# Patient Record
Sex: Female | Born: 1970
Health system: Southern US, Community
[De-identification: ages and names within clinical notes are randomized; demographics above are authoritative.]

## PROBLEM LIST (undated history)

## (undated) DIAGNOSIS — L509 Urticaria, unspecified: Secondary | ICD-10-CM

## (undated) DIAGNOSIS — E119 Type 2 diabetes mellitus without complications: Secondary | ICD-10-CM

## (undated) DIAGNOSIS — I10 Essential (primary) hypertension: Secondary | ICD-10-CM

## (undated) DIAGNOSIS — R7303 Prediabetes: Secondary | ICD-10-CM

## (undated) DIAGNOSIS — G8929 Other chronic pain: Secondary | ICD-10-CM

## (undated) HISTORY — PX: ABDOMINAL HYSTERECTOMY: SHX81

## (undated) HISTORY — PX: TUBAL LIGATION: SHX77

## (undated) HISTORY — DX: Prediabetes: R73.03

## (undated) HISTORY — PX: BREAST CYST EXCISION: SHX579

## (undated) HISTORY — PX: WISDOM TOOTH EXTRACTION: SHX21

## (undated) HISTORY — DX: Other chronic pain: G89.29

## (undated) HISTORY — PX: DILATION AND CURETTAGE OF UTERUS: SHX78

## (undated) HISTORY — PX: OTHER SURGICAL HISTORY: SHX169

## (undated) NOTE — *Deleted (*Deleted)
Cardiology Office Note:    Date:  04/13/2020   ID:  Crystal Perry, DOB 05-11-1971, MRN [id number]  PCP:  Lavada Mesi, MD  Cardiologist:  Nona Dell, MD  Referring MD: Lavada Mesi, MD   No chief complaint on file.   History of Present Illness:    Crystal Perry is a 35 y.o. female with a hx of *** who is seen as a new consult at the request of Hilts, Michael, MD for the evaluation and management of ***.  Note from 02/09/20 with Dr. Prince Rome reviewed. No specific cardiac concerns noted. Seen in the ER 03/31/20 for dizziness. No orthostatic symptoms, and no drop in vitals per notes with orthostatic vital signs. Referred for low pulse rate, charted as 53 bpm.  Last seen by Dr. Purvis Sheffield in 2015. Had been seen preoperatively for evaluation prior to bariatric surgery. ETT, echo reviewed. Had been recommended to follow up as needed.  Past Medical History:  Diagnosis Date  . Hives   . SVD (spontaneous vaginal delivery)    x 1    Past Surgical History:  Procedure Laterality Date  . ABDOMINAL HYSTERECTOMY    . BREAST CYST EXCISION Right   . cyst from breast     right breast  . CYSTOSCOPY  03/26/2012   Procedure: CYSTOSCOPY;  Surgeon: Esmeralda Arthur, MD;  Location: [facility];  Service: Gynecology;  Laterality: N/A;  . DILATION AND CURETTAGE OF UTERUS    . ROBOTIC ASSISTED SUPRACERVICAL HYSTERECTOMY  03/26/12  . TUBAL LIGATION    . tubes tied    . WISDOM TOOTH EXTRACTION      Current Medications: Current Outpatient Medications on File Prior to Visit  Medication Sig  . baclofen (LIORESAL) 10 MG tablet Take 0.5-1 tablets (5-10 mg total) by mouth 3 (three) times daily as needed for muscle spasms.  Marland Kitchen gabapentin (NEURONTIN) 300 MG capsule 1 PO q HS, may increase to 1 PO TID if needed/tolerated (Patient not taking: Reported on 10/26/2019)  . meclizine (ANTIVERT) 25 MG tablet Take 1 tablet (25 mg total) by mouth 3 (three) times daily as needed for dizziness.  . naproxen (NAPROSYN) 500  MG tablet Take 1 tablet (500 mg total) by mouth 2 (two) times daily as needed.  . promethazine (PHENERGAN) 25 MG tablet Take 1 tablet (25 mg total) by mouth every 6 (six) hours as needed for nausea or vomiting.  . traMADol (ULTRAM) 50 MG tablet Take 1 tablet (50 mg total) by mouth 2 (two) times daily as needed.   No current facility-administered medications on file prior to visit.     Allergies:   Bactrim [sulfamethoxazole-trimethoprim]   Social History   Tobacco Use  . Smoking status: Former Smoker    Quit date: 03/01/2018    Years since quitting: 2.1  . Smokeless tobacco: Never Used  Vaping Use  . Vaping Use: Never used  Substance Use Topics  . Alcohol use: No  . Drug use: No    Family History: family history includes Alcohol abuse in her father; Asthma in her daughter; Breast cancer in her maternal aunt; Cancer in her maternal aunt and maternal grandfather; Cirrhosis in her father; Colon cancer in her maternal grandfather; Diabetes in her maternal grandmother and mother; Hyperlipidemia in her mother; Hypertension in her mother; Prostate cancer in her maternal grandfather; Stroke in her mother.  ROS:   Please see the history of present illness.  Additional pertinent ROS: Constitutional: Negative for chills, fever, night sweats, unintentional weight loss  HENT: Negative for ear pain and hearing loss.   Eyes: Negative for loss of vision and eye pain.  Respiratory: Negative for cough, sputum, wheezing.   Cardiovascular: See HPI. Gastrointestinal: Negative for abdominal pain, melena, and hematochezia.  Genitourinary: Negative for dysuria and hematuria.  Musculoskeletal: Negative for falls and myalgias.  Skin: Negative for itching and rash.  Neurological: Negative for focal weakness, focal sensory changes and loss of consciousness.  Endo/Heme/Allergies: Does not bruise/bleed easily.     EKGs/Labs/Other Studies Reviewed:    The following studies were reviewed today: ***  EKG:   EKG is personally reviewed.  The ekg ordered today demonstrates ***  Recent Labs: 02/09/2020: ALT 17; TSH 2.78 03/31/2020: BUN 19; Creatinine, Ser 0.95; Hemoglobin 13.2; Platelets 424; Potassium 4.7; Sodium 142  Recent Lipid Panel    Component Value Date/Time   CHOL 148 02/09/2020 1053   TRIG 148 02/09/2020 1053   HDL 34 (L) 02/09/2020 1053   CHOLHDL 4.4 02/09/2020 1053   VLDL 14 08/07/2007 2010   LDLCALC 89 02/09/2020 1053    Physical Exam:    VS:  LMP 03/26/2012     Wt Readings from Last 3 Encounters:  03/31/20 201 lb (91.2 kg)  02/09/20 199 lb (90.3 kg)  10/26/19 196 lb (88.9 kg)    GEN: Well nourished, well developed in no acute distress HEENT: Normal, moist mucous membranes NECK: No JVD CARDIAC: regular rhythm, normal S1 and S2, no rubs or gallops. No murmurs. VASCULAR: Radial and DP pulses 2+ bilaterally. No carotid bruits RESPIRATORY:  Clear to auscultation without rales, wheezing or rhonchi  ABDOMEN: Soft, non-tender, non-distended MUSCULOSKELETAL:  Ambulates independently SKIN: Warm and dry, no edema NEUROLOGIC:  Alert and oriented x 3. No focal neuro deficits noted. PSYCHIATRIC:  Normal affect    ASSESSMENT:    No diagnosis found. PLAN:     Cardiac risk counseling and prevention recommendations: -recommend heart healthy/Mediterranean diet, with whole grains, fruits, vegetable, fish, lean meats, nuts, and olive oil. Limit salt. -recommend moderate walking, 3-5 times/week for 30-50 minutes each session. Aim for at least 150 minutes.week. Goal should be pace of 3 miles/hours, or walking 1.5 miles in 30 minutes -recommend avoidance of tobacco products. Avoid excess alcohol. -Additional risk factor control:  -Diabetes risk: A1c is  -Lipids:   -Blood pressure control:  -Weight:  -ASCVD risk score: The 10-year ASCVD risk score Denman George DC Montez Hageman., et al., 2013) is: 7.8%   Values used to calculate the score:     Age: 89 years     Sex: Female     Is Non-Hispanic  African American: Yes     Diabetic: Yes     Tobacco smoker: No     Systolic Blood Pressure: 135 mmHg     Is BP treated: No     HDL Cholesterol: 34 mg/dL     Total Cholesterol: 148 mg/dL    Plan for follow up:  Jodelle Red, MD, PhD [place]  [facility]    Medication Adjustments/Labs and Tests Ordered: Current medicines are reviewed at length with the patient today.  Concerns regarding medicines are outlined above.  No orders of the defined types were placed in this encounter.  No orders of the defined types were placed in this encounter.   There are no Patient Instructions on file for this visit.  Signed, Jodelle Red, MD PhD 04/13/2020 1:15 PM    [facility]

---

## 2002-09-20 ENCOUNTER — Encounter: Payer: Self-pay | Admitting: *Deleted

## 2002-09-20 ENCOUNTER — Ambulatory Visit (HOSPITAL_COMMUNITY): Admission: RE | Admit: 2002-09-20 | Discharge: 2002-09-20 | Payer: Self-pay | Admitting: *Deleted

## 2007-01-19 ENCOUNTER — Emergency Department (HOSPITAL_COMMUNITY): Admission: EM | Admit: 2007-01-19 | Discharge: 2007-01-19 | Payer: Self-pay | Admitting: Emergency Medicine

## 2007-03-17 ENCOUNTER — Ambulatory Visit (HOSPITAL_COMMUNITY): Admission: RE | Admit: 2007-03-17 | Discharge: 2007-03-17 | Payer: Self-pay | Admitting: Urology

## 2007-03-28 ENCOUNTER — Emergency Department (HOSPITAL_COMMUNITY): Admission: EM | Admit: 2007-03-28 | Discharge: 2007-03-28 | Payer: Self-pay | Admitting: Emergency Medicine

## 2007-08-06 ENCOUNTER — Ambulatory Visit: Payer: Self-pay | Admitting: Internal Medicine

## 2007-08-06 DIAGNOSIS — D72829 Elevated white blood cell count, unspecified: Secondary | ICD-10-CM | POA: Insufficient documentation

## 2007-08-06 DIAGNOSIS — E669 Obesity, unspecified: Secondary | ICD-10-CM | POA: Insufficient documentation

## 2007-08-06 DIAGNOSIS — K449 Diaphragmatic hernia without obstruction or gangrene: Secondary | ICD-10-CM | POA: Insufficient documentation

## 2007-08-06 DIAGNOSIS — R599 Enlarged lymph nodes, unspecified: Secondary | ICD-10-CM | POA: Insufficient documentation

## 2007-08-07 ENCOUNTER — Encounter (INDEPENDENT_AMBULATORY_CARE_PROVIDER_SITE_OTHER): Payer: Self-pay | Admitting: Internal Medicine

## 2007-08-08 LAB — CONVERTED CEMR LAB
Basophils Absolute: 0 10*3/uL (ref 0.0–0.1)
Calcium: 8.8 mg/dL (ref 8.4–10.5)
Cholesterol: 109 mg/dL (ref 0–200)
Eosinophils Absolute: 0.2 10*3/uL (ref 0.0–0.7)
Eosinophils Relative: 2 % (ref 0–5)
Glucose, Bld: 94 mg/dL (ref 70–99)
HCT: 37.2 % (ref 36.0–46.0)
Lymphs Abs: 3.6 10*3/uL (ref 0.7–4.0)
MCHC: 31.5 g/dL (ref 30.0–36.0)
MCV: 88.6 fL (ref 78.0–100.0)
Platelets: 391 10*3/uL (ref 150–400)
RDW: 15.2 % (ref 11.5–15.5)
Sodium: 141 meq/L (ref 135–145)
Total CHOL/HDL Ratio: 3.1

## 2007-08-10 ENCOUNTER — Encounter (INDEPENDENT_AMBULATORY_CARE_PROVIDER_SITE_OTHER): Payer: Self-pay | Admitting: Internal Medicine

## 2007-08-10 ENCOUNTER — Telehealth (INDEPENDENT_AMBULATORY_CARE_PROVIDER_SITE_OTHER): Payer: Self-pay | Admitting: *Deleted

## 2007-08-12 ENCOUNTER — Telehealth (INDEPENDENT_AMBULATORY_CARE_PROVIDER_SITE_OTHER): Payer: Self-pay | Admitting: *Deleted

## 2007-08-13 ENCOUNTER — Ambulatory Visit (HOSPITAL_COMMUNITY): Admission: RE | Admit: 2007-08-13 | Discharge: 2007-08-13 | Payer: Self-pay | Admitting: Internal Medicine

## 2007-08-19 ENCOUNTER — Telehealth (INDEPENDENT_AMBULATORY_CARE_PROVIDER_SITE_OTHER): Payer: Self-pay | Admitting: *Deleted

## 2007-08-19 ENCOUNTER — Encounter (INDEPENDENT_AMBULATORY_CARE_PROVIDER_SITE_OTHER): Payer: Self-pay | Admitting: Internal Medicine

## 2007-08-30 ENCOUNTER — Encounter (INDEPENDENT_AMBULATORY_CARE_PROVIDER_SITE_OTHER): Payer: Self-pay | Admitting: Internal Medicine

## 2007-12-01 ENCOUNTER — Emergency Department (HOSPITAL_COMMUNITY): Admission: EM | Admit: 2007-12-01 | Discharge: 2007-12-01 | Payer: Self-pay | Admitting: Emergency Medicine

## 2008-12-08 ENCOUNTER — Encounter (INDEPENDENT_AMBULATORY_CARE_PROVIDER_SITE_OTHER): Payer: Self-pay | Admitting: Internal Medicine

## 2009-02-25 ENCOUNTER — Emergency Department (HOSPITAL_COMMUNITY): Admission: EM | Admit: 2009-02-25 | Discharge: 2009-02-25 | Payer: Self-pay | Admitting: Emergency Medicine

## 2010-06-30 ENCOUNTER — Encounter (HOSPITAL_COMMUNITY): Payer: Self-pay | Admitting: Internal Medicine

## 2011-03-24 LAB — URINALYSIS, ROUTINE W REFLEX MICROSCOPIC
Glucose, UA: NEGATIVE
Hgb urine dipstick: NEGATIVE
Protein, ur: NEGATIVE

## 2011-03-24 LAB — URINE MICROSCOPIC-ADD ON

## 2011-03-24 LAB — URINE CULTURE: Colony Count: NO GROWTH

## 2011-06-10 HISTORY — PX: LAPAROSCOPIC GASTRIC SLEEVE RESECTION: SHX5895

## 2011-06-10 HISTORY — PX: CHOLECYSTECTOMY: SHX55

## 2011-12-17 ENCOUNTER — Encounter: Payer: Self-pay | Admitting: Obstetrics and Gynecology

## 2012-01-21 ENCOUNTER — Encounter: Payer: Self-pay | Admitting: Obstetrics and Gynecology

## 2012-01-22 ENCOUNTER — Encounter: Payer: 59 | Admitting: Obstetrics and Gynecology

## 2012-01-27 ENCOUNTER — Encounter: Payer: 59 | Admitting: Obstetrics and Gynecology

## 2012-02-06 ENCOUNTER — Telehealth: Payer: Self-pay | Admitting: Obstetrics and Gynecology

## 2012-02-06 ENCOUNTER — Ambulatory Visit (INDEPENDENT_AMBULATORY_CARE_PROVIDER_SITE_OTHER): Payer: 59 | Admitting: Obstetrics and Gynecology

## 2012-02-06 ENCOUNTER — Other Ambulatory Visit: Payer: Self-pay | Admitting: Obstetrics and Gynecology

## 2012-02-06 ENCOUNTER — Encounter: Payer: Self-pay | Admitting: Obstetrics and Gynecology

## 2012-02-06 VITALS — BP 116/78 | Temp 98.9°F | Wt 209.0 lb

## 2012-02-06 DIAGNOSIS — N920 Excessive and frequent menstruation with regular cycle: Secondary | ICD-10-CM

## 2012-02-06 DIAGNOSIS — N921 Excessive and frequent menstruation with irregular cycle: Secondary | ICD-10-CM

## 2012-02-06 DIAGNOSIS — R102 Pelvic and perineal pain: Secondary | ICD-10-CM

## 2012-02-06 DIAGNOSIS — N949 Unspecified condition associated with female genital organs and menstrual cycle: Secondary | ICD-10-CM

## 2012-02-06 LAB — POCT URINALYSIS DIPSTICK
Ketones, UA: NEGATIVE
Spec Grav, UA: 1.02
pH, UA: 5

## 2012-02-06 LAB — THYROID PANEL WITH TSH
T4, Total: 9.3 ug/dL (ref 5.0–12.5)
TSH: 1.97 u[IU]/mL (ref 0.350–4.500)

## 2012-02-06 MED ORDER — NAPROXEN SODIUM 550 MG PO TABS: 550.0000 mg | ORAL_TABLET | Freq: Two times a day (BID) | ORAL | Status: DC | PRN

## 2012-02-06 NOTE — Telephone Encounter (Signed)
TC to pt. LM to return call regarding message. 

## 2012-02-06 NOTE — Progress Notes (Signed)
Contraception: tubal ligation History of STD:  no history of PID, STD's History of ovarian cyst: no History of fibroids: no mom had history of fibroids . History of endometriosis:no Previous ultrasound:no  Urinary symptoms: none Gastro-intestinal symptoms:  Constipation: no      Diarrhea: no     Nausea: no     Vomiting: no     Fever: no Vaginal discharge: no vaginal discharge  Pt is a ngyn with complaints of pelvic pain ? Pt's last pap was done 07/2011 wnl . Pt also stated very heavy flow with clots and cramps. Subjective:     Crystal Perry is a 41 y.o. female G2P1011 who presents with complaints of menorrhagia and abdominal/pelvic pain.   Cycle monthly, 4 days with 4 heavy days requiring 1 tampon every hour but needs to wear pad as well. Clots ad 3 cm.Dysmenorrhea starts the day before, and lasts 48 hours. Intensity 8/10 partially improved with Tylenol. BTB for the last 2-3 months, spotting, 1-2 weeks before the cycle. Pelvic pain for 6 months, on/off, not cycle related, sharp with heaviness, low pelvis, no radiation. Intensity 10/10. Was seen at the emergency room in [place] in May. No testing done.  Associated symptoms: nausea Family history: Positive for fibroids in mother ( hyst at 31) .  Pertinent gyn history:   WUJ:WJXBJYN reviewed. No pertinent past medical history.   Medication: (Not in a hospital admission) Allergies:No Known Allergies    Review of systems:   Pertinent items are noted in HPI.  Objective:    BP 116/78  Temp 98.9 F (37.2 C)  Wt 209 lb (94.802 kg)  LMP 01/04/2012  Weight: Wt Readings from Last 1 Encounters:  02/06/12 209 lb (94.802 kg)    BMI: There is no height on file to calculate BMI.  General Appearance: Alert, appropriate appearance for age. No acute distress HEENT: Grossly normal Neck / Thyroid: Supple, no masses, nodes or enlargement Lungs: clear to auscultation bilaterally Back: No CVA tenderness Cardiovascular: Regular rate and rhythm.  S1, S2, no murmur Gastrointestinal: Soft, non-tender, no masses or organomegaly Pelvic Exam: Vulva and vagina appear normal. Bimanual exam reveals normal uterus and adnexa. Rectovaginal: normal rectal, no masses       Assessment and Plan:   Unexplained pelvic pain with menometrorrhagia Blood work and ultrasound ordered Follow-up: results and plan Pt given info on Novasure  Turci, Bonnie J  MD 8/30/201310:14 AM

## 2012-02-06 NOTE — Telephone Encounter (Signed)
TC to pt.  States was informed that Rx for pain was to be given but not at pharmacy. Also did not receive appt for U/S. Per Dr SR, Rx E-scribed. [name] will contact appt with U/S appt.

## 2012-02-06 NOTE — Patient Instructions (Signed)
Novasure

## 2012-02-17 ENCOUNTER — Telehealth: Payer: Self-pay | Admitting: Obstetrics and Gynecology

## 2012-02-17 NOTE — Telephone Encounter (Signed)
LAURA/APPT REQ/SR PT

## 2012-02-20 NOTE — Telephone Encounter (Signed)
LM for pt to call on Monday to discuss options for appt.  Unable to find anything earlier than 03-03-12.  ld

## 2012-03-03 ENCOUNTER — Other Ambulatory Visit: Payer: Self-pay | Admitting: Obstetrics and Gynecology

## 2012-03-03 ENCOUNTER — Ambulatory Visit (INDEPENDENT_AMBULATORY_CARE_PROVIDER_SITE_OTHER): Payer: 59

## 2012-03-03 ENCOUNTER — Ambulatory Visit (INDEPENDENT_AMBULATORY_CARE_PROVIDER_SITE_OTHER): Payer: 59 | Admitting: Obstetrics and Gynecology

## 2012-03-03 ENCOUNTER — Encounter: Payer: Self-pay | Admitting: Obstetrics and Gynecology

## 2012-03-03 VITALS — BP 136/84 | Wt 213.0 lb

## 2012-03-03 DIAGNOSIS — D259 Leiomyoma of uterus, unspecified: Secondary | ICD-10-CM

## 2012-03-03 DIAGNOSIS — R102 Pelvic and perineal pain: Secondary | ICD-10-CM

## 2012-03-03 DIAGNOSIS — N949 Unspecified condition associated with female genital organs and menstrual cycle: Secondary | ICD-10-CM

## 2012-03-03 DIAGNOSIS — D219 Benign neoplasm of connective and other soft tissue, unspecified: Secondary | ICD-10-CM | POA: Insufficient documentation

## 2012-03-03 NOTE — Progress Notes (Signed)
Subjective:    Crystal Perry is a 41 y.o. female, G2P1011, who presents for Gyn ultrasound because of menorrhagia and pelvic pain.  The following portions of the patient's history were reviewed and updated as appropriate: allergies, current medications, past family history.  Objective:    BP 136/84  Wt 213 lb (96.616 kg)  LMP 02/23/2012    Weight:  Wt Readings from Last 1 Encounters:  03/03/12 213 lb (96.616 kg)          BMI: There is no height on file to calculate BMI.  ULTRASOUND: Uterus   AV    LABS: FSH normal    Adnexa        Prolactin normal    Endometrium 0.342 mm   Thyroid panel normal    Free fluid: no    Other findings:    RT mometrial:  2.9 x 3 x 2.9cm    RT fundus: 2.3 x 1.7 x 1.9cm    Mid ut. Anterior:  1.3 x 1.2 x 1cm    Several other 1 cm fibroids are seen.    CX:  Multiple 1 - 1.5cm nabothian cysts are seen.    Thin endometrium.  3D images confirm no submucosal fibroids.    RTOV: simple cyst = 3 x 3.3 x 3cm    LTOV WNL          Assessment:    Symptomatic uterine fibroids    Plan:     The following was reviewed with patient:    Benign nature of fibroids as well as unpredictable growth during reproductive years.      Possible fibroid symptoms including: menorrhagia, dysmenorrhea, urinary frequency, pelvic pain, and back pain.     Expected resolution/improvement of symptoms with menopausal state.    Treatment options: expectant management, NSAIDS, oral contraceptives,     Depo Provera, Uterine Artery Embolization, myomectomy, and hysterectomy.  Surgical approach options were also reviewed including, laparoscopic, robotically assisted and abdominal. Risks, benefits and limitations also discussed.  After 25  minutes face to face encounter, the patient requests to proceed with robotic hysterectomy but will try Loloestrin ( to start today) in the meantime. 2 samples given

## 2012-03-03 NOTE — Patient Instructions (Signed)
Patient Education Materials to be provided at check out (*indicates is located in Garment/textile technologist):  *Da Vinci Hysterectomy

## 2012-03-04 ENCOUNTER — Telehealth: Payer: Self-pay | Admitting: Obstetrics and Gynecology

## 2012-03-04 NOTE — Telephone Encounter (Signed)
Robotic Hysterectomy scheduled for 03/26/12 @ 7:30am with SR/EP.  [employer] effective 06/10/11. Plan pays 80/20 after a $400 deductible.  Pre-op due $339.75  -Adrianne Pridgen

## 2012-03-05 ENCOUNTER — Other Ambulatory Visit: Payer: Self-pay | Admitting: Obstetrics and Gynecology

## 2012-03-08 ENCOUNTER — Encounter (HOSPITAL_COMMUNITY): Payer: Self-pay | Admitting: Pharmacist

## 2012-03-10 ENCOUNTER — Telehealth: Payer: Self-pay | Admitting: Obstetrics and Gynecology

## 2012-03-10 NOTE — Telephone Encounter (Signed)
sr pt 

## 2012-03-10 NOTE — Telephone Encounter (Signed)
Pt has questions concerning surgery and possible r/s due to new job.  Will call me on Fri with final information.  ld

## 2012-03-16 ENCOUNTER — Encounter (HOSPITAL_COMMUNITY): Payer: Self-pay

## 2012-03-16 ENCOUNTER — Encounter (HOSPITAL_COMMUNITY)
Admission: RE | Admit: 2012-03-16 | Discharge: 2012-03-16 | Disposition: A | Discharge status: Home / Self Care | Payer: 59 | Source: Ambulatory Visit | Attending: Obstetrics and Gynecology | Admitting: Obstetrics and Gynecology

## 2012-03-16 LAB — CBC
HCT: 36.1 % (ref 36.0–46.0)
MCV: 85.5 fL (ref 78.0–100.0)
Platelets: 473 10*3/uL — ABNORMAL HIGH (ref 150–400)
RBC: 4.22 MIL/uL (ref 3.87–5.11)
WBC: 11 10*3/uL — ABNORMAL HIGH (ref 4.0–10.5)

## 2012-03-16 NOTE — Patient Instructions (Signed)
   Your procedure is scheduled on: Friday, Oct 18  Enter through the Main Entrance of [facility] at: 6 am Pick up the phone at the desk and dial [phone] and inform us of your arrival.  Please call this number if you have any problems the morning of surgery: [phone]  Remember: Do not eat food after midnight: Thursday Do not drink clear liquids after: Thursday Take these medicines the morning of surgery with a SIP OF WATER:none  Do not wear jewelry, make-up, or FINGER nail polish No metal in your hair or on your body. Do not wear lotions, powders, perfumes. You may wear deodorant.  Please use your CHG wash as directed prior to surgery.  Do not shave anywhere for at least 12 hours prior to first CHG shower.  Do not bring valuables to the hospital. Contacts, dentures or bridgework may not be worn into surgery.  Leave suitcase in the car. After Surgery it may be brought to your room. For patients being admitted to the hospital, checkout time is 11:00am the day of discharge.   Home with friend Sherri Scales cell [phone].  Patients discharged on the day of surgery will not be allowed to drive home.

## 2012-03-23 ENCOUNTER — Ambulatory Visit (INDEPENDENT_AMBULATORY_CARE_PROVIDER_SITE_OTHER): Payer: 59 | Admitting: Obstetrics and Gynecology

## 2012-03-23 ENCOUNTER — Encounter: Payer: Self-pay | Admitting: Obstetrics and Gynecology

## 2012-03-23 VITALS — BP 122/80 | HR 68 | Temp 98.6°F | Resp 16 | Ht 63.0 in | Wt 214.0 lb

## 2012-03-23 DIAGNOSIS — Z01818 Encounter for other preprocedural examination: Secondary | ICD-10-CM

## 2012-03-23 NOTE — Progress Notes (Signed)
Crystal Perry is a 41 y.o. female G2P1011 who presents for hysterectomy because of menometrorrhagia, pelvic pain and uterine fibroids.  Over the past year, Crystal Perry's periods have become heavier and bleeding pattern erratic.  Her menstrual flow, that  lasts for four days,  requires the hourly change of a pad and tampon.  In addition, the patient will bleed for seven days at other times during the month, requiring a pad and tampon change every forty-five minutes. With her menses, the patient will have cramping rated at a 10/10 (on a 10 point pain scale)  with minimal relief from Tylenol. For the past three months Crystal Perry has  experienced intermittent pelvic pain,  throughout the month, without initiating or alleviating factors that will sometimes keep her from  work.  This pain is sharp and is accompanied by a feeling of heaviness and pressure in the pelvic area.  She goes on to report urinary frequency, dyspareunia and lower back pain but denies vaginitis symptoms, incontinence,  or change in bowel movements. On occasion she has had to visit the ER in [place] for pain management.  A pelvic ultrasound in August 2013 showed: uterus-9.64 x 6.51 x 5.54 cm, endometrium-0.342 cm; Multiple myometrial fibroids:  right 2.9 x 1.7 x 2.9 cm, right fundus 2.3 x 1.7 x 1.9 cm and midline anterior 1.3 x 1.2 x 1.0 cm with several other 1 cm fibroids; ovaries appear normal except there was a right simple cyst 3.0 x 3.3 x 3.0 cm. Lab results of a TSH and  Prolactin,  done at that same time,  were normal and an  FSH revealed non-menopausal.  A review of both medical and surgical management options were given to the patient however, she desires definitive therapy in the form of hysterectomy.  She is aware that relief of her pain cannot be guaranteed however, the hysterectomy would alleviate her periods.   Past Medical History  OB History: G2P1011 SVD, 1996 (5 lbs, 3 oz)  GYN History: menarche-41 YO;  LMP-02/23/12;  Contracepton:  Tubal Sterilization;  The patient denies history of sexually transmitted disease.  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 naproxen sodium (ANAPROX) 550 MG tablet Take 550 mg by mouth as needed. As needed for pain, can take every 12 hours as needed      . Norethin-Eth Estrad-Fe Biphas (LO LOESTRIN FE PO) Take 1 tablet by mouth. Pt started on 03/01/12.        No Known Allergies  Denies sensitivity to peanuts, shellfish, soy, latex or adhesives.   ROS:  Denies headache, vision changes, nasal congestion, dysphagia, tinnitus, dizziness, hoarseness, cough,  chest pain, shortness of breath, nausea, vomiting, diarrhea,constipation,  urinary urgency  dysuria, hematuria, vaginitis symptoms, swelling of joints,easy bruising,  myalgias, arthralgias, skin rashes, unexplained weight loss and except as is mentioned in the history of present illness, patient's review of systems is otherwise negative.  Physical Exam    BP 122/80  Pulse 68  Temp 98.6 F (37 C) (Oral)  Resp 16    Ht 5' 3" (1.6 m)  Wt 214 lb (97.07 kg)  BMI 37.91 kg/m2  LMP 02/23/2012  Neck: supple without masses or thyromegaly Lungs: clear to auscultation Heart: regular rate and rhythm Abdomen: soft, non-tender and no organomegaly  Pelvic:EGBUS- wnl; vagina-normal rugae, moderate blood; uterus-appears ULNS though exam limited by habitus, cervix without lesions or motion tenderness; adnexae-no tenderness or  masses Extremities:  no clubbing, cyanosis or edema   Assesment:  Menometrorrhagia                       Pelvic Pain            Uterine Fibroids   Disposition:  The patient was given the indication for her procedure(s) along with the risks, which include but are not limited to, reaction to anesthesia, damage to adjacent organs, infection, excessive bleeding, formation of scar tissue, early menopause, pelvic prolapse and the possible need for an open abdominal incision. She was further advised that she will experience transient post operative facial edema, that her hospital stay is expected to be 0-2 days, she should be able to return to her usual activities within 2-3 weeks (except intercourse to be delayed until 6 weeks) and that the robotic approach to her surgery requires more time to perform than an open abdominal approach.  Patient was given the Miralax bowel prep to be completed 24 hours prior to procedure.  She verbalized understanding of these risks and pre-operative instructions and has consented to proceed with a  Robot Assisted Total Laparoscopic Hysterectomy at [facility], March 26, 2012 at 7:30 a.m.  CSN# [id number]   Crystal Eline J. Tzivia Oneil, PA-C  for Crystal Perry   

## 2012-03-23 NOTE — H&P (Signed)
Crystal Perry is a 41 y.o. female G2P1011 who presents for hysterectomy because of menometrorrhagia, pelvic pain and uterine fibroids.  Over the past year, Crystal Perry periods have become heavier and bleeding pattern erratic.  Her menstrual flow, that  lasts for four days,  requires the hourly change of a pad and tampon.  In addition, the patient will bleed for seven days at other times during the month, requiring a pad and tampon change every forty-five minutes. With her menses, the patient will have cramping rated at a 10/10 (on a 10 point pain scale)  with minimal relief from Tylenol. For the past three months Crystal Perry has  experienced intermittent pelvic pain,  throughout the month, without initiating or alleviating factors that will sometimes keep her from  work.  This pain is sharp and is accompanied by a feeling of heaviness and pressure in the pelvic area.  She goes on to report urinary frequency, dyspareunia and lower back pain but denies vaginitis symptoms, incontinence,  or change in bowel movements. On occasion she has had to visit the ER in [place] for pain management.  A pelvic ultrasound in August 2013 showed: uterus-9.64 x 6.51 x 5.54 cm, endometrium-0.342 cm; Multiple myometrial fibroids:  right 2.9 x 1.7 x 2.9 cm, right fundus 2.3 x 1.7 x 1.9 cm and midline anterior 1.3 x 1.2 x 1.0 cm with several other 1 cm fibroids; ovaries appear normal except there was a right simple cyst 3.0 x 3.3 x 3.0 cm. Lab results of a TSH and  Prolactin,  done at that same time,  were normal and an  [facility] revealed non-menopausal.  A review of both medical and surgical management options were given to the patient however, she desires definitive therapy in the form of hysterectomy.  She is aware that relief of her pain cannot be guaranteed however, the hysterectomy would alleviate her periods.   Past Medical History  OB History: G2P1011 SVD, 1996 (5 lbs, 3 oz)  GYN History: menarche-41 YO;  LMP-02/23/12;  Contracepton:  Tubal Sterilization;  The patient denies history of sexually transmitted disease.  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 naproxen sodium (ANAPROX) 550 MG tablet Take 550 mg by mouth as needed. As needed for pain, can take every 12 hours as needed      . Norethin-Eth Estrad-Fe Biphas (LO LOESTRIN FE PO) Take 1 tablet by mouth. Pt started on 03/01/12.        No Known Allergies  Denies sensitivity to peanuts, shellfish, soy, latex or adhesives.   ROS:  Denies headache, vision changes, nasal congestion, dysphagia, tinnitus, dizziness, hoarseness, cough,  chest pain, shortness of breath, nausea, vomiting, diarrhea,constipation,  urinary urgency  dysuria, hematuria, vaginitis symptoms, swelling of joints,easy bruising,  myalgias, arthralgias, skin rashes, unexplained weight loss and except as is mentioned in the history of present illness, patient's review of systems is otherwise negative.  Physical Exam    BP 122/80  Pulse 68  Temp 98.6 F (37 C) (Oral)  Resp 16  Ht 5\' 3"  (1.6 m)  Wt 214 lb (97.07 kg)  BMI 37.91 kg/m2  LMP 02/23/2012  Neck: supple without masses or thyromegaly Lungs: clear to auscultation Heart: regular rate and rhythm Abdomen: soft, non-tender and no organomegaly  Pelvic:EGBUS- wnl; vagina-normal rugae, moderate blood; uterus-appears ULNS though exam limited by habitus, cervix without lesions or motion tenderness; adnexae-no tenderness or  masses Extremities:  no clubbing, cyanosis or edema   Assesment:  Menometrorrhagia                       Pelvic Pain            Uterine Fibroids   Disposition:  The patient was given the indication for her procedure(s) along with the risks, which include but are not limited to, reaction to anesthesia, damage to adjacent organs, infection, excessive bleeding, formation of scar tissue, early menopause, pelvic prolapse and the possible need for an open abdominal incision. She was further advised that she will experience transient post operative facial edema, that her hospital stay is expected to be 0-2 days, she should be able to return to her usual activities within 2-3 weeks (except intercourse to be delayed until 6 weeks) and that the robotic approach to her surgery requires more time to perform than an open abdominal approach.  Patient was given the Miralax bowel prep to be completed 24 hours prior to procedure.  She verbalized understanding of these risks and pre-operative instructions and has consented to proceed with a  Robot Assisted Total Laparoscopic Hysterectomy at [facility] [place], March 26, 2012 at 7:30 a.m.  CSN# [id number]   Jillian Pianka J. Lowell Guitar, PA-C  for Dr. Crist Fat. Rivard

## 2012-03-25 MED ORDER — DEXTROSE 5 % IV SOLN
2.0000 g | INTRAVENOUS | Status: AC
  Administered 2012-03-26: 2 g via INTRAVENOUS
  Filled 2012-03-25: qty 2

## 2012-03-26 ENCOUNTER — Encounter (HOSPITAL_COMMUNITY): Payer: Self-pay | Admitting: Anesthesiology

## 2012-03-26 ENCOUNTER — Ambulatory Visit (HOSPITAL_COMMUNITY)
Admission: RE | Admit: 2012-03-26 | Discharge: 2012-03-26 | Disposition: A | Discharge status: Home / Self Care | Payer: 59 | Source: Ambulatory Visit | Attending: Obstetrics and Gynecology | Admitting: Obstetrics and Gynecology

## 2012-03-26 ENCOUNTER — Ambulatory Visit (HOSPITAL_COMMUNITY): Payer: 59 | Admitting: Anesthesiology

## 2012-03-26 ENCOUNTER — Encounter (HOSPITAL_COMMUNITY)
Admission: RE | Disposition: A | Discharge status: Home / Self Care | Payer: Self-pay | Source: Ambulatory Visit | Attending: Obstetrics and Gynecology

## 2012-03-26 ENCOUNTER — Encounter (HOSPITAL_COMMUNITY): Payer: Self-pay

## 2012-03-26 DIAGNOSIS — N8000 Endometriosis of the uterus, unspecified: Secondary | ICD-10-CM | POA: Insufficient documentation

## 2012-03-26 DIAGNOSIS — N949 Unspecified condition associated with female genital organs and menstrual cycle: Secondary | ICD-10-CM | POA: Insufficient documentation

## 2012-03-26 DIAGNOSIS — D259 Leiomyoma of uterus, unspecified: Secondary | ICD-10-CM

## 2012-03-26 DIAGNOSIS — N920 Excessive and frequent menstruation with regular cycle: Secondary | ICD-10-CM | POA: Insufficient documentation

## 2012-03-26 HISTORY — PX: CYSTOSCOPY: SHX5120

## 2012-03-26 HISTORY — PX: ROBOTIC ASSISTED SUPRACERVICAL HYSTERECTOMY: SHX6083

## 2012-03-26 SURGERY — ROBOTIC ASSISTED SUPRACERVICAL HYSTERECTOMY
Anesthesia: General | Site: Bladder | Wound class: Clean Contaminated

## 2012-03-26 MED ORDER — OXYCODONE-ACETAMINOPHEN 5-325 MG PO TABS
1.0000 | ORAL_TABLET | ORAL | Status: DC | PRN
  Administered 2012-03-26: 2 via ORAL
  Filled 2012-03-26: qty 2

## 2012-03-26 MED ORDER — GLYCOPYRROLATE 0.2 MG/ML IJ SOLN
INTRAMUSCULAR | Status: AC
  Filled 2012-03-26: qty 1

## 2012-03-26 MED ORDER — NEOSTIGMINE METHYLSULFATE 1 MG/ML IJ SOLN
INTRAMUSCULAR | Status: AC
  Filled 2012-03-26: qty 10

## 2012-03-26 MED ORDER — KETOROLAC TROMETHAMINE 60 MG/2ML IM SOLN: 30.0000 mg | Freq: Once | INTRAMUSCULAR | Status: DC

## 2012-03-26 MED ORDER — SCOPOLAMINE 1 MG/3DAYS TD PT72
MEDICATED_PATCH | TRANSDERMAL | Status: DC | PRN
  Administered 2012-03-26: 1 via TRANSDERMAL

## 2012-03-26 MED ORDER — BUPIVACAINE HCL (PF) 0.25 % IJ SOLN
INTRAMUSCULAR | Status: AC
  Filled 2012-03-26: qty 30

## 2012-03-26 MED ORDER — ROCURONIUM BROMIDE 100 MG/10ML IV SOLN
INTRAVENOUS | Status: DC | PRN
  Administered 2012-03-26 (×3): 20 mg via INTRAVENOUS
  Administered 2012-03-26: 50 mg via INTRAVENOUS

## 2012-03-26 MED ORDER — FENTANYL CITRATE 0.05 MG/ML IJ SOLN
INTRAMUSCULAR | Status: AC
  Filled 2012-03-26: qty 5

## 2012-03-26 MED ORDER — KETOROLAC TROMETHAMINE 30 MG/ML IJ SOLN
15.0000 mg | Freq: Once | INTRAMUSCULAR | Status: AC | PRN
  Administered 2012-03-26: 30 mg via INTRAVENOUS

## 2012-03-26 MED ORDER — IBUPROFEN 600 MG PO TABS
600.0000 mg | ORAL_TABLET | Freq: Four times a day (QID) | ORAL | Status: DC | PRN
  Administered 2012-03-26: 600 mg via ORAL
  Filled 2012-03-26: qty 1

## 2012-03-26 MED ORDER — IBUPROFEN 600 MG PO TABS: ORAL_TABLET | ORAL | Status: DC

## 2012-03-26 MED ORDER — LACTATED RINGERS IR SOLN
Status: DC | PRN
  Administered 2012-03-26: 3000 mL

## 2012-03-26 MED ORDER — STERILE WATER FOR IRRIGATION IR SOLN
Status: DC | PRN
  Administered 2012-03-26: 1000 mL via INTRAVESICAL

## 2012-03-26 MED ORDER — MENTHOL 3 MG MT LOZG: 1.0000 | LOZENGE | OROMUCOSAL | Status: DC | PRN

## 2012-03-26 MED ORDER — PROPOFOL 10 MG/ML IV EMUL
INTRAVENOUS | Status: DC | PRN
  Administered 2012-03-26: 250 mg via INTRAVENOUS

## 2012-03-26 MED ORDER — MIDAZOLAM HCL 5 MG/5ML IJ SOLN
INTRAMUSCULAR | Status: DC | PRN
  Administered 2012-03-26: 2 mg via INTRAVENOUS

## 2012-03-26 MED ORDER — BUPIVACAINE HCL (PF) 0.25 % IJ SOLN
INTRAMUSCULAR | Status: DC | PRN
  Administered 2012-03-26: 17 mL

## 2012-03-26 MED ORDER — INDIGOTINDISULFONATE SODIUM 8 MG/ML IJ SOLN
INTRAMUSCULAR | Status: AC
  Filled 2012-03-26: qty 5

## 2012-03-26 MED ORDER — ONDANSETRON HCL 4 MG PO TABS
4.0000 mg | ORAL_TABLET | Freq: Three times a day (TID) | ORAL | Status: DC | PRN
  Filled 2012-03-26: qty 1

## 2012-03-26 MED ORDER — ONDANSETRON HCL 4 MG/2ML IJ SOLN
INTRAMUSCULAR | Status: AC
  Filled 2012-03-26: qty 2

## 2012-03-26 MED ORDER — GLYCOPYRROLATE 0.2 MG/ML IJ SOLN
INTRAMUSCULAR | Status: AC
  Filled 2012-03-26: qty 4

## 2012-03-26 MED ORDER — KETOROLAC TROMETHAMINE 30 MG/ML IJ SOLN
INTRAMUSCULAR | Status: AC
  Filled 2012-03-26: qty 1

## 2012-03-26 MED ORDER — ONDANSETRON HCL 4 MG/2ML IJ SOLN
INTRAMUSCULAR | Status: DC | PRN
  Administered 2012-03-26: 4 mg via INTRAVENOUS

## 2012-03-26 MED ORDER — LIDOCAINE HCL (CARDIAC) 20 MG/ML IV SOLN
INTRAVENOUS | Status: DC | PRN
  Administered 2012-03-26: 20 mg via INTRAVENOUS

## 2012-03-26 MED ORDER — INDIGOTINDISULFONATE SODIUM 8 MG/ML IJ SOLN
INTRAMUSCULAR | Status: DC | PRN
  Administered 2012-03-26: 5 mL via INTRAVENOUS

## 2012-03-26 MED ORDER — ROCURONIUM BROMIDE 50 MG/5ML IV SOLN
INTRAVENOUS | Status: AC
  Filled 2012-03-26: qty 1

## 2012-03-26 MED ORDER — OXYCODONE-ACETAMINOPHEN 5-325 MG PO TABS: 1.0000 | ORAL_TABLET | ORAL | Status: DC | PRN

## 2012-03-26 MED ORDER — ACETAMINOPHEN 10 MG/ML IV SOLN: 1000.0000 mg | Freq: Once | INTRAVENOUS | Status: DC

## 2012-03-26 MED ORDER — ONDANSETRON HCL 4 MG PO TABS: 4.0000 mg | ORAL_TABLET | Freq: Three times a day (TID) | ORAL | Status: DC | PRN

## 2012-03-26 MED ORDER — LIDOCAINE HCL (CARDIAC) 20 MG/ML IV SOLN
INTRAVENOUS | Status: AC
  Filled 2012-03-26: qty 5

## 2012-03-26 MED ORDER — PROPOFOL 10 MG/ML IV EMUL
INTRAVENOUS | Status: AC
  Filled 2012-03-26: qty 20

## 2012-03-26 MED ORDER — LACTATED RINGERS IV SOLN
INTRAVENOUS | Status: DC
  Administered 2012-03-26 (×3): via INTRAVENOUS

## 2012-03-26 MED ORDER — SUCCINYLCHOLINE CHLORIDE 20 MG/ML IJ SOLN
INTRAMUSCULAR | Status: DC | PRN
  Administered 2012-03-26: 140 mg via INTRAVENOUS

## 2012-03-26 MED ORDER — FENTANYL CITRATE 0.05 MG/ML IJ SOLN
INTRAMUSCULAR | Status: DC | PRN
  Administered 2012-03-26: 50 ug via INTRAVENOUS
  Administered 2012-03-26 (×2): 100 ug via INTRAVENOUS
  Administered 2012-03-26 (×2): 50 ug via INTRAVENOUS
  Administered 2012-03-26: 100 ug via INTRAVENOUS
  Administered 2012-03-26 (×3): 50 ug via INTRAVENOUS

## 2012-03-26 MED ORDER — DEXAMETHASONE SODIUM PHOSPHATE 10 MG/ML IJ SOLN
INTRAMUSCULAR | Status: AC
  Filled 2012-03-26: qty 1

## 2012-03-26 MED ORDER — DEXAMETHASONE SODIUM PHOSPHATE 4 MG/ML IJ SOLN
INTRAMUSCULAR | Status: DC | PRN
  Administered 2012-03-26: 10 mg via INTRAVENOUS

## 2012-03-26 MED ORDER — SUCCINYLCHOLINE CHLORIDE 20 MG/ML IJ SOLN
INTRAMUSCULAR | Status: AC
  Filled 2012-03-26: qty 10

## 2012-03-26 MED ORDER — GLYCOPYRROLATE 0.2 MG/ML IJ SOLN
INTRAMUSCULAR | Status: DC | PRN
  Administered 2012-03-26 (×2): 0.2 mg via INTRAVENOUS

## 2012-03-26 MED ORDER — MIDAZOLAM HCL 2 MG/2ML IJ SOLN
INTRAMUSCULAR | Status: AC
  Filled 2012-03-26: qty 2

## 2012-03-26 MED ORDER — KETOROLAC TROMETHAMINE 30 MG/ML IJ SOLN
INTRAMUSCULAR | Status: AC
  Administered 2012-03-26: 30 mg via INTRAMUSCULAR
  Filled 2012-03-26: qty 2

## 2012-03-26 MED ORDER — LACTATED RINGERS IV SOLN
INTRAVENOUS | Status: DC
  Administered 2012-03-26: 13:00:00 via INTRAVENOUS

## 2012-03-26 MED ORDER — FENTANYL CITRATE 0.05 MG/ML IJ SOLN
INTRAMUSCULAR | Status: AC
  Filled 2012-03-26: qty 2

## 2012-03-26 MED ORDER — FENTANYL CITRATE 0.05 MG/ML IJ SOLN: 25.0000 ug | INTRAMUSCULAR | Status: DC | PRN

## 2012-03-26 MED ORDER — FENTANYL CITRATE 0.05 MG/ML IJ SOLN
INTRAMUSCULAR | Status: AC
Start: 2012-03-26 — End: 2012-03-26
  Filled 2012-03-26: qty 2

## 2012-03-26 SURGICAL SUPPLY — 68 items
BAG URINE DRAINAGE (UROLOGICAL SUPPLIES) ×4 IMPLANT
BARRIER ADHS 3X4 INTERCEED (GAUZE/BANDAGES/DRESSINGS) ×4 IMPLANT
BENZOIN TINCTURE PRP APPL 2/3 (GAUZE/BANDAGES/DRESSINGS) IMPLANT
BLADE LAP MORCELLATOR 15X9.5 (ELECTROSURGICAL) ×4 IMPLANT
CABLE HIGH FREQUENCY MONO STRZ (ELECTRODE) ×4 IMPLANT
CATH FOLEY 3WAY  5CC 16FR (CATHETERS) ×1
CATH FOLEY 3WAY  5CC 18FR (CATHETERS)
CATH FOLEY 3WAY 5CC 16FR (CATHETERS) ×3 IMPLANT
CATH FOLEY 3WAY 5CC 18FR (CATHETERS) IMPLANT
CHLORAPREP W/TINT 26ML (MISCELLANEOUS) ×4 IMPLANT
CLOTH BEACON ORANGE TIMEOUT ST (SAFETY) ×4 IMPLANT
CONT PATH 16OZ SNAP LID 3702 (MISCELLANEOUS) ×4 IMPLANT
CORDS BIPOLAR (ELECTRODE) IMPLANT
COVER MAYO STAND STRL (DRAPES) ×4 IMPLANT
COVER TABLE BACK 60X90 (DRAPES) ×8 IMPLANT
COVER TIP SHEARS 8 DVNC (MISCELLANEOUS) ×3 IMPLANT
COVER TIP SHEARS 8MM DA VINCI (MISCELLANEOUS) ×1
DECANTER SPIKE VIAL GLASS SM (MISCELLANEOUS) IMPLANT
DERMABOND ADVANCED (GAUZE/BANDAGES/DRESSINGS) ×1
DERMABOND ADVANCED .7 DNX12 (GAUZE/BANDAGES/DRESSINGS) ×3 IMPLANT
DRAPE HUG U DISPOSABLE (DRAPE) ×4 IMPLANT
DRAPE LG THREE QUARTER DISP (DRAPES) ×8 IMPLANT
DRAPE MONITOR DA VINCI (DRAPE) IMPLANT
DRAPE WARM FLUID 44X44 (DRAPE) ×4 IMPLANT
ELECT REM PT RETURN 9FT ADLT (ELECTROSURGICAL) ×4
ELECTRODE REM PT RTRN 9FT ADLT (ELECTROSURGICAL) ×3 IMPLANT
EVACUATOR SMOKE 8.L (FILTER) ×4 IMPLANT
GAUZE VASELINE 3X9 (GAUZE/BANDAGES/DRESSINGS) ×4 IMPLANT
GLOVE BIOGEL PI IND STRL 7.0 (GLOVE) ×9 IMPLANT
GLOVE BIOGEL PI INDICATOR 7.0 (GLOVE) ×3
GLOVE ECLIPSE 6.5 STRL STRAW (GLOVE) ×12 IMPLANT
GOWN STRL REIN XL XLG (GOWN DISPOSABLE) ×32 IMPLANT
GRASPER BIPOLAR FEN DA VINCI (INSTRUMENTS)
GRASPER BPLR FEN DVNC (INSTRUMENTS) IMPLANT
KIT ACCESSORY DA VINCI DISP (KITS) ×1
KIT ACCESSORY DVNC DISP (KITS) ×3 IMPLANT
KIT DISP ACCESSORY 4 ARM (KITS) IMPLANT
NEEDLE HYPO 22GX1.5 SAFETY (NEEDLE) ×4 IMPLANT
OCCLUDER COLPOPNEUMO (BALLOONS) ×4 IMPLANT
PACK LAVH (CUSTOM PROCEDURE TRAY) ×4 IMPLANT
PAD PREP 24X48 CUFFED NSTRL (MISCELLANEOUS) ×8 IMPLANT
PLUG CATH AND CAP STER (CATHETERS) ×4 IMPLANT
PROTECTOR NERVE ULNAR (MISCELLANEOUS) ×12 IMPLANT
SET CYSTO W/LG BORE CLAMP LF (SET/KITS/TRAYS/PACK) ×4 IMPLANT
SET IRRIG TUBING LAPAROSCOPIC (IRRIGATION / IRRIGATOR) ×4 IMPLANT
SLEEVE SURGEON STRL (DRAPES) ×4 IMPLANT
SOLUTION ELECTROLUBE (MISCELLANEOUS) ×4 IMPLANT
SPONGE LAP 18X18 X RAY DECT (DISPOSABLE) IMPLANT
STRIP CLOSURE SKIN 1/2X4 (GAUZE/BANDAGES/DRESSINGS) IMPLANT
SUT MNCRL AB 3-0 PS2 27 (SUTURE) ×4 IMPLANT
SUT VIC AB 0 CT1 27 (SUTURE) ×6
SUT VIC AB 0 CT1 27XBRD ANBCTR (SUTURE) ×6 IMPLANT
SUT VIC AB 0 CT1 27XBRD ANTBC (SUTURE) ×12 IMPLANT
SUT VICRYL 0 UR6 27IN ABS (SUTURE) ×8 IMPLANT
SYR 50ML LL SCALE MARK (SYRINGE) ×4 IMPLANT
SYSTEM CONVERTIBLE TROCAR (TROCAR) ×4 IMPLANT
TIP UTERINE 5.1X6CM LAV DISP (MISCELLANEOUS) IMPLANT
TIP UTERINE 6.7X10CM GRN DISP (MISCELLANEOUS) IMPLANT
TIP UTERINE 6.7X6CM WHT DISP (MISCELLANEOUS) IMPLANT
TIP UTERINE 6.7X8CM BLUE DISP (MISCELLANEOUS) ×4 IMPLANT
TOWEL OR 17X24 6PK STRL BLUE (TOWEL DISPOSABLE) ×12 IMPLANT
TROCAR 12M 150ML BLUNT (TROCAR) ×4 IMPLANT
TROCAR DISP BLADELESS 8 DVNC (TROCAR) ×3 IMPLANT
TROCAR DISP BLADELESS 8MM (TROCAR) ×1
TROCAR XCEL 12X100 BLDLESS (ENDOMECHANICALS) ×4 IMPLANT
TUBING FILTER THERMOFLATOR (ELECTROSURGICAL) ×4 IMPLANT
WARMER LAPAROSCOPE (MISCELLANEOUS) ×4 IMPLANT
WATER STERILE IRR 1000ML POUR (IV SOLUTION) ×12 IMPLANT

## 2012-03-26 NOTE — Op Note (Signed)
preoperative diagnosis: symptomatic uterine fibroids  Postoperative diagnosis: Same  Anesthesia: Gen.  Anesthesiologist: Dr. Mal Amabile  Procedure: Robotically-assisted supracervical hysterectomy and cystoscopy  Surgeon: Dr. Dois Davenport Aryon Nham  Assistant: Henreitta Leber P.A.-C.  Estimated blood loss: 50 cc  Procedure:  After being informed of the planned procedure with possible complications including but not limited to bleeding, infection, injury to other organs, need for laparotomy, expected hospitals they in recovery, informed consent was obtained and patient was taken to or #7. She was given general anesthesia with endotracheal intubation without any complication. She was placed in the lithotomy position on a sticky mattress and beanbag with arms padded and tucked on each side and knee-high sequential compressive devices. She was then prepped and draped in a sterile fashion.  Pelvic exam reveals an anteverted enlarged uterus approximately 12-14 weeks in size and adnexa are not felt to to body habitus. A weighted speculum is inserted in the vagina and the anterior lip of the cervix is grasped with a tenaculum forcep. The uterus is sounded at 10 cm. The cervix was easily dilated using Hegar dilator until #21 to achieve easy placement of a #8 RUMI intrauterine manipulator with a 4.0 KOH ring. A three-way Foley catheter is inserted in the bladder.  We infiltrate 3 cm above the umbilicus using Marcaine 0.25 and we perform a 10 mm vertical incision. This is brought down bluntly to the fascia which is identified and grasped with Coker forceps. The fascia is incised with Mayo scissors and peritoneum is entered bluntly. A pursestring suture of 0 Vicryl is placed on the fascia and a 10 mm Hassan trocar is easily inserted in the abdominal cavity and held in place by the Purstring suture. This allows for easy insufflation of a pneumoperitoneum using warmed CO2 at a maximum pressure of 15 mm of mercury.  The patient is then positioned in Trendelenburg. First evaluation reveals an enlarged uterus with multiple small subserosal fibroids, tubes that show signs of tubal ligation with a ring, 2 normal ovaries, free anterior and posterior cul-de-sac, normal appendix and normal liver. Trocar placement is decided and each site is infiltrated with Marcaine 0.25. We placed a 8mm robotic trocar on the left, 8 8mm robotic trocar on the right and a 10 mm patient side assistant trocar on the right all under direct visualization.  Preparation and docking is a 35 minutes.  Starting on the right side, and using PK gyrus forcep and monopolar scissors, we cauterize the right tube and sectioned it. We then cauterize the utero-ovarian ligament and sectioned it. We cauterize the round ligament and sectioned. This gives [place] access to the anterior sheath of the broad ligament which is sharply dissected all the way to or across the lower uterine segment. It is then easy to sharply and bluntly dissect the bladder away from the cervix and the vaginal cuff identified by the KOH ring. The posterior sheath of the broad ligament is then dissected all the way down to the posterior KOH ring. In the process the right ureter is easily identified and its course is below our dissection plane. We then skeletonized the vascular bundle on the right.  Moving to the left side, we cauterized the round ligament and sectioned it. We cauterize the utero-ovarian ligament and sectioned at. We cauterize the tube and sectioned. Entry into the broad ligament is easy and the parauterine space is easily dissected bluntly. The posterior sheath of the broad ligament is dissected all the way down to the KOH ring keeping  the left ureter under direct position. The anterior sheath of the broad ligament is then dissected all the way to meet the previous right-sided dissection. The bladder is the with 200 cc of saline to confirm a satisfactory dissection below the KOH  ring. We skeletonize the vascular bundle and are now able to cauterize it using PK gyrus forcep applying pressure on the KOH ring and keeping the ureter under direct visualization. We proceed in the same way on the right side cauterizing the vascular bundle at the level of the coring in its ascending branch keeping the ureter under direct visualization.  Using an open monopolar scissors we then sectioned the the uterine body from the cervix starting on the anterior aspect until we meet the RUMI manipulator. We perform a 360 incision and freed the uterus completely. We complete hemostasis on the cervical stump using PK gyrus forcep.  We profusely irrigated the pelvis with warm saline to confirm a satisfactory hemostasis. The RUMI manipulator is removed and the endocervical canal is cauterized both with bipolar and monopolar energy.  The robot is undocked, the 10 mm right-sided patient trocar is removed and replaced with the morcellator trocar. We then proceed with morcellation of the specimen which requires 18 minutes after only one hour and 40 minutes of console time.  We again irrigated profusely with warm saline and confirm a satisfactory hemostasis. All trochars are then removed under direct visualization after a vacuolated and the pneumoperitoneum.  The fascia of the supraumbilical incision is closed with a Purstring suture of 0 Vicryl. The fascia of the right-sided patient's side assistant trocar is closed with a figure-of-eight stitch of 0 Vicryl. The skin of all incisions is closed with a subcuticular suture of 3-0 Monocryl and Dermabond.  After given IV indigo carmine we proceed with a postoperative cystoscopy which rapidly confirms an intact bladder as well as 2 normal ureteral openings with ureteral jets. The bladder is then drained.  Total operating time is 3 hours and 10 minutes.  Instrument and sponge count is complete x2. Estimated blood loss is 50 cc. The procedure is well tolerated  by the patient is taken to recovery room in a well and stable condition.  Specimen: Uterus weighing 178 g sent to pathology

## 2012-03-26 NOTE — Transfer of Care (Signed)
Immediate Anesthesia Transfer of Care Note  Patient: Crystal Perry  Procedure(s) Performed: Procedure(s) (LRB) with comments: ROBOTIC ASSISTED SUPRACERVICAL HYSTERECTOMY (N/A) CYSTOSCOPY (N/A)  Patient Location: PACU  Anesthesia Type: General  Level of Consciousness: awake, alert  and oriented  Airway & Oxygen Therapy: Patient Spontanous Breathing and Patient connected to nasal cannula oxygen  Post-op Assessment: Report given to PACU RN and Post -op Vital signs reviewed and stable  Post vital signs: Reviewed and stable  Complications: No apparent anesthesia complications

## 2012-03-26 NOTE — H&P (Signed)
Crystal Perry is a 40 y.o. female G2P1011 who presents for hysterectomy because of menometrorrhagia, pelvic pain and uterine fibroids.  Over the past year, Crystal Perry's periods have become heavier and bleeding pattern erratic.  Her menstrual flow, that  lasts for four days,  requires the hourly change of a pad and tampon.  In addition, the patient will bleed for seven days at other times during the month, requiring a pad and tampon change every forty-five minutes. With her menses, the patient will have cramping rated at a 10/10 (on a 10 point pain scale)  with minimal relief from Tylenol. For the past three months Crystal Perry has  experienced intermittent pelvic pain,  throughout the month, without initiating or alleviating factors that will sometimes keep her from  work.  This pain is sharp and is accompanied by a feeling of heaviness and pressure in the pelvic area.  She goes on to report urinary frequency, dyspareunia and lower back pain but denies vaginitis symptoms, incontinence,  or change in bowel movements. On occasion she has had to visit the ER in [place] for pain management.  A pelvic ultrasound in August 2013 showed: uterus-9.64 x 6.51 x 5.54 cm, endometrium-0.342 cm; Multiple myometrial fibroids:  right 2.9 x 1.7 x 2.9 cm, right fundus 2.3 x 1.7 x 1.9 cm and midline anterior 1.3 x 1.2 x 1.0 cm with several other 1 cm fibroids; ovaries appear normal except there was a right simple cyst 3.0 x 3.3 x 3.0 cm. Lab results of a TSH and  Prolactin,  done at that same time,  were normal and an  FSH revealed non-menopausal.  A review of both medical and surgical management options were given to the patient however, she desires definitive therapy in the form of hysterectomy.  She is aware that relief of her pain cannot be guaranteed however, the hysterectomy would alleviate her periods.   Past Medical History  OB History: G2P1011 SVD, 1996 (5 lbs, 3 oz)  GYN History: menarche-41 YO;  LMP-02/23/12;  Contracepton:  Tubal Sterilization;  The patient denies history of sexually transmitted disease.  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 naproxen sodium (ANAPROX) 550 MG tablet Take 550 mg by mouth as needed. As needed for pain, can take every 12 hours as needed      . Norethin-Eth Estrad-Fe Biphas (LO LOESTRIN FE PO) Take 1 tablet by mouth. Pt started on 03/01/12.        No Known Allergies  Denies sensitivity to peanuts, shellfish, soy, latex or adhesives.   ROS:  Denies headache, vision changes, nasal congestion, dysphagia, tinnitus, dizziness, hoarseness, cough,  chest pain, shortness of breath, nausea, vomiting, diarrhea,constipation,  urinary urgency  dysuria, hematuria, vaginitis symptoms, swelling of joints,easy bruising,  myalgias, arthralgias, skin rashes, unexplained weight loss and except as is mentioned in the history of present illness, patient's review of systems is otherwise negative.  Physical Exam    BP 122/80  Pulse 68  Temp 98.6 F (37 C) (Oral)  Resp 16    Ht 5' 3" (1.6 m)  Wt 214 lb (97.07 kg)  BMI 37.91 kg/m2  LMP 02/23/2012  Neck: supple without masses or thyromegaly Lungs: clear to auscultation Heart: regular rate and rhythm Abdomen: soft, non-tender and no organomegaly  Pelvic:EGBUS- wnl; vagina-normal rugae, moderate blood; uterus-appears ULNS though exam limited by habitus, cervix without lesions or motion tenderness; adnexae-no tenderness or  masses Extremities:  no clubbing, cyanosis or edema   Assesment:  Menometrorrhagia                       Pelvic Pain            Uterine Fibroids   Disposition:  The patient was given the indication for her procedure(s) along with the risks, which include but are not limited to, reaction to anesthesia, damage to adjacent organs, infection, excessive bleeding, formation of scar tissue, early menopause, pelvic prolapse and the possible need for an open abdominal incision. She was further advised that she will experience transient post operative facial edema, that her hospital stay is expected to be 0-2 days, she should be able to return to her usual activities within 2-3 weeks (except intercourse to be delayed until 6 weeks) and that the robotic approach to her surgery requires more time to perform than an open abdominal approach.  Patient was given the Miralax bowel prep to be completed 24 hours prior to procedure.  She verbalized understanding of these risks and pre-operative instructions and has consented to proceed with a  Robot Assisted Total Laparoscopic Hysterectomy at [facility], March 26, 2012 at 7:30 a.m.  CSN# [id number]   Isolde Skaff J. Soraida Vickers, PA-C  for Dr. Sandra A. Rivard   

## 2012-03-26 NOTE — Anesthesia Postprocedure Evaluation (Signed)
  Anesthesia Post-op Note  Patient: Crystal Perry  Procedure(s) Performed: Procedure(s) (LRB) with comments: ROBOTIC ASSISTED SUPRACERVICAL HYSTERECTOMY (N/A) CYSTOSCOPY (N/A)  Patient Location: PACU and [facility]  Anesthesia Type: General  Level of Consciousness: awake, alert  and oriented  Airway and Oxygen Therapy: Patient Spontanous Breathing  Post-op Pain: mild  Post-op Assessment: Post-op Vital signs reviewed  Post-op Vital Signs: Reviewed and stable  Complications: No apparent anesthesia complications

## 2012-03-26 NOTE — Discharge Summary (Signed)
  Physician Discharge Summary  Patient ID: Crystal Perry MRN: [id number] DOB/AGE: 1970-08-16 41 y.o.  Admit date: 03/26/2012 Discharge date: 03/26/2012   Discharge Diagnoses:  Menorrhagia,  Pelvic Pain, Symptomatic Uterine Fibroids Active Problems:  * No active hospital problems. *    Operation: Robot Assisted Supracervical Laparoscopic Hysterectomy and Cystoscopy   Discharged Condition: Good  Hospital Course: On the date of admission, the patient underwent the aforementioned procedures and tolerated them well.  Post operative course was unremarkable, with patient resuming bowel and bladder function and was therefore deemed ready for discharge home.   Disposition: Home to self care  Discharge Medications:   Crystal Perry, Crystal Perry  Home Medication Instructions [id number]   Printed on:03/26/12 1135  Medication Information                    oxyCODONE-acetaminophen (ROXICET) 5-325 MG per tablet Take 1 tablet by mouth every 4 (four) hours as needed for pain.           ibuprofen (ADVIL,MOTRIN) 600 MG tablet 1 po pc every 6 hours x 5 days then prn           ondansetron (ZOFRAN) 4 MG tablet Take 1 tablet (4 mg total) by mouth every 8 (eight) hours as needed for nausea.              Follow-up: Dr. Estanislado Pandy,  April 09, 2012 at 9:20 a.m.   SignedHenreitta Leber, PA-C 03/26/2012, 11:35 AM

## 2012-03-26 NOTE — Addendum Note (Signed)
Addendum  created 03/26/12 1736 by Armanda Heritage, RN   Modules edited:Notes Section

## 2012-03-26 NOTE — Interval H&P Note (Signed)
History and Physical Interval Note:  03/26/2012 7:24 AM  Crystal Perry  has presented today for surgery, with the diagnosis of Fibroids, Menorrhagia, Dysmenorrhea  The various methods of treatment have been discussed with the patient and family. After consideration of risks, benefits and other options for treatment, the patient has consented to  Procedure(s) (LRB) with comments: ROBOTIC ASSISTED SUPRACERVICAL  HYSTERECTOMY (N/A) as a surgical intervention .  The patient's history has been reviewed, patient examined, no change in status, stable for surgery.  I have reviewed the patient's chart and labs.  Questions were answered to the patient's satisfaction.     Edoardo Laforte A

## 2012-03-26 NOTE — Interval H&P Note (Deleted)
History and Physical Interval Note:  03/26/2012 7:23 AM  Crystal Perry  has presented today for surgery, with the diagnosis of Fibroids, Menorrhagia, Dysmenorrhea  The various methods of treatment have been discussed with the patient and family. After consideration of risks, benefits and other options for treatment, the patient has consented to  Procedure(s) (LRB) with comments: ROBOTIC ASSISTED TOTAL HYSTERECTOMY (N/A) as a surgical intervention .  The patient's history has been reviewed, patient examined, no change in status, stable for surgery.  I have reviewed the patient's chart and labs.  Questions were answered to the patient's satisfaction.     Hever Castilleja A

## 2012-03-26 NOTE — Anesthesia Preprocedure Evaluation (Signed)
Anesthesia Evaluation  Patient identified by MRN, date of birth, ID band Patient awake    Reviewed: Allergy & Precautions, H&P , NPO status , Patient's Chart, lab work & pertinent test results, reviewed documented beta blocker date and time   History of Anesthesia Complications Negative for: history of anesthetic complications  Airway Mallampati: III TM Distance: >3 FB Neck ROM: full    Dental  (+) Teeth Intact   Pulmonary neg pulmonary ROS,  breath sounds clear to auscultation  Pulmonary exam normal       Cardiovascular Exercise Tolerance: Good negative cardio ROS  Rhythm:regular Rate:Normal     Neuro/Psych negative neurological ROS  negative psych ROS   GI/Hepatic negative GI ROS, Neg liver ROS,   Endo/Other  Obese - BMI 38  Renal/GU negative Renal ROS  Female GU complaint     Musculoskeletal   Abdominal   Peds  Hematology negative hematology ROS (+)   Anesthesia Other Findings   Reproductive/Obstetrics negative OB ROS                           Anesthesia Physical Anesthesia Plan  ASA: II  Anesthesia Plan: General ETT   Post-op Pain Management:    Induction:   Airway Management Planned:   Additional Equipment:   Intra-op Plan:   Post-operative Plan:   Informed Consent: I have reviewed the patients History and Physical, chart, labs and discussed the procedure including the risks, benefits and alternatives for the proposed anesthesia with the patient or authorized representative who has indicated his/her understanding and acceptance.   Dental Advisory Given  Plan Discussed with: CRNA and Surgeon  Anesthesia Plan Comments:         Anesthesia Quick Evaluation

## 2012-03-26 NOTE — Anesthesia Postprocedure Evaluation (Signed)
  Anesthesia Post-op Note  Patient: Crystal Perry  Procedure(s) Performed: Procedure(s) (LRB) with comments: ROBOTIC ASSISTED SUPRACERVICAL HYSTERECTOMY (N/A) CYSTOSCOPY (N/A)  Patient Location: PACU  Anesthesia Type: General  Level of Consciousness: awake, alert  and oriented  Airway and Oxygen Therapy: Patient Spontanous Breathing  Post-op Pain: none  Post-op Assessment: Post-op Vital signs reviewed, Patient's Cardiovascular Status Stable, Respiratory Function Stable, Patent Airway, No signs of Nausea or vomiting and Pain level controlled  Post-op Vital Signs: Reviewed and stable  Complications: No apparent anesthesia complications

## 2012-03-26 NOTE — Anesthesia Procedure Notes (Signed)
Procedure Name: Intubation Date/Time: 03/26/2012 7:41 AM Performed by: Lincoln Brigham Pre-anesthesia Checklist: Patient identified, Patient being monitored, Emergency Drugs available, Timeout performed and Suction available Patient Re-evaluated:Patient Re-evaluated prior to inductionOxygen Delivery Method: Circle system utilized Preoxygenation: Pre-oxygenation with 100% oxygen Intubation Type: IV induction Ventilation: Mask ventilation without difficulty Laryngoscope Size: Miller and 2 Grade View: Grade II Tube type: Oral Tube size: 7.0 mm Number of attempts: 1

## 2012-03-29 ENCOUNTER — Encounter (HOSPITAL_COMMUNITY): Payer: Self-pay | Admitting: Obstetrics and Gynecology

## 2012-04-09 ENCOUNTER — Ambulatory Visit (INDEPENDENT_AMBULATORY_CARE_PROVIDER_SITE_OTHER): Payer: 59 | Admitting: Obstetrics and Gynecology

## 2012-04-09 ENCOUNTER — Encounter: Payer: Self-pay | Admitting: Obstetrics and Gynecology

## 2012-04-09 VITALS — BP 118/70 | Temp 99.0°F | Ht 63.0 in | Wt 211.0 lb

## 2012-04-09 DIAGNOSIS — N938 Other specified abnormal uterine and vaginal bleeding: Secondary | ICD-10-CM

## 2012-04-09 DIAGNOSIS — N949 Unspecified condition associated with female genital organs and menstrual cycle: Secondary | ICD-10-CM

## 2012-04-09 NOTE — Progress Notes (Signed)
Surgery: Robotic Supracervical  Hysterectomy  Date: 03/26/2012  Eating a regular diet without difficulty. Bowel movements are normal.  The patient is not having any pain.  Bladder function is returned to normal. Vaginal bleeding: none Vaginal discharge: no vaginal discharge    Subjective:     Crystal Perry is a 41 y.o. female who presents for post-op visit.  Pathology report was reviewed with patient.  Uterus, amputated and morcellated - LEIOMYOMATA AND ADENOMYOSIS. - ENDOMETRIUM: BENIGN PROLIFERATIVE ENDOMETRIUM, NO ATYPIA, HYPERPLASIA OR MALIGNANCY.  The following portions of the patient's history were reviewed and updated as appropriate: allergies, current medications, past family history, past medical history, past social history, past surgical history and problem list.  Review of Systems Pertinent items are noted in HPI.   Objective:    BP 118/70  Temp 99 F (37.2 C) (Oral)  Ht 5\' 3"  (1.6 m)  Wt 211 lb (95.709 kg)  BMI 37.38 kg/m2 Weight:  Wt Readings from Last 1 Encounters:  04/09/12 211 lb (95.709 kg)    BMI: Body mass index is 37.38 kg/(m^2).  General Appearance: Alert, appropriate appearance for age. No acute distress Lungs: clear to auscultation bilaterally Back: No CVA tenderness Cardiovascular: Regular rate and rhythm. S1, S2, no murmur Gastrointestinal: Soft, non-tender, no masses or organomegaly Incision/s: healing well with a 3 x 3 cm nodule at the supraumbilical incision that is non-tender Pelvic Exam: Bimanual exam normal  Ultrasound of abdominal wall: hematoma with intact fascia  Assessment:    Doing well postoperatively. Operative findings again reviewed.   Plan:   OOW for total of 6 weeks: pt works as a Lawyer and remains with lifting restrictions Follow-up 2 weeks  Silverio Lay MD 11/1/20138:48 AM

## 2012-05-05 ENCOUNTER — Encounter: Payer: Self-pay | Admitting: Obstetrics and Gynecology

## 2012-05-05 ENCOUNTER — Ambulatory Visit (INDEPENDENT_AMBULATORY_CARE_PROVIDER_SITE_OTHER): Payer: 59 | Admitting: Obstetrics and Gynecology

## 2012-05-05 VITALS — BP 124/80 | Ht 63.0 in | Wt 214.0 lb

## 2012-05-05 DIAGNOSIS — Z9889 Other specified postprocedural states: Secondary | ICD-10-CM

## 2012-05-05 NOTE — Progress Notes (Signed)
Surgery: Robotic Laparoscopic Hysterectomy  Date: 03/26/2012  Eating a regular diet without difficulty. Bowel movements are normal.  The patient is not having any pain.  Bladder function is returned to normal. Vaginal bleeding: none Vaginal discharge: no vaginal discharge    Subjective:     Crystal Perry is a 41 y.o. female who presents for post-op visit.  Pathology report:  was reviewed with patient. Uterus, amputated and morcellated - LEIOMYOMATA AND ADENOMYOSIS. - ENDOMETRIUM: BENIGN PROLIFERATIVE ENDOMETRIUM, NO ATYPIA, HYPERPLASIA OR MALIGNANCY. Abigail Miyamoto MD Pathologist, Electronic Signature (Case signed 03/29/2012)  The following portions of the patient's history were reviewed and updated as appropriate: allergies, current medications, past family history, past medical history, past social history, past surgical history and problem list.  Review of Systems Pertinent items are noted in HPI.   Objective:    BP 124/80  Ht 5\' 3"  (1.6 m)  Wt 214 lb (97.07 kg)  BMI 37.91 kg/m2  LMP 03/26/2012 Weight:  Wt Readings from Last 1 Encounters:  05/05/12 214 lb (97.07 kg)    BMI: Body mass index is 37.91 kg/(m^2).  General Appearance: Alert, appropriate appearance for age. No acute distress Gastrointestinal: Soft, non-tender, no masses or organomegaly Incision/s: healing well Pelvic Exam: Bimanual exam normal no pain  Assessment:    Doing well postoperatively. Operative findings again reviewed.   Plan:  Generic Maderma Reccommended for Incisions Returning to sexual activity discussed AEX 03/2013  Silverio Lay MD 11/27/201311:01 AM

## 2012-05-17 ENCOUNTER — Telehealth: Payer: Self-pay | Admitting: Obstetrics and Gynecology

## 2012-05-17 NOTE — Telephone Encounter (Signed)
TC to pt.  States has vaginal D/ C. WAs seen at ER.  Diagnosed with BV.  Cultures also done. Appt here not needed.

## 2012-05-19 ENCOUNTER — Telehealth: Payer: Self-pay | Admitting: Obstetrics and Gynecology

## 2012-05-19 NOTE — Telephone Encounter (Signed)
Pt called, states had a hysterectomy 03/26/12, for the past couple of days she has been having light red d/c with wiping, is not having to wear a pad.  Pt says she stands for @ least 10 hours @ work, denies cramping or pain, unsure if it is unrelated.  C/w SR, per SR informed pt that was normal, pt advised to continue watching, if bldg becomes hvy or any other concerns to call the office.

## 2012-12-20 ENCOUNTER — Emergency Department (HOSPITAL_COMMUNITY)
Admission: EM | Admit: 2012-12-20 | Discharge: 2012-12-20 | Disposition: A | Discharge status: Home / Self Care | Payer: Managed Care, Other (non HMO) | Attending: Emergency Medicine | Admitting: Emergency Medicine

## 2012-12-20 ENCOUNTER — Emergency Department (HOSPITAL_COMMUNITY): Payer: Managed Care, Other (non HMO)

## 2012-12-20 ENCOUNTER — Encounter (HOSPITAL_COMMUNITY): Payer: Self-pay | Admitting: *Deleted

## 2012-12-20 DIAGNOSIS — R109 Unspecified abdominal pain: Secondary | ICD-10-CM | POA: Insufficient documentation

## 2012-12-20 DIAGNOSIS — R112 Nausea with vomiting, unspecified: Secondary | ICD-10-CM | POA: Insufficient documentation

## 2012-12-20 DIAGNOSIS — Z9851 Tubal ligation status: Secondary | ICD-10-CM | POA: Insufficient documentation

## 2012-12-20 DIAGNOSIS — Z90710 Acquired absence of both cervix and uterus: Secondary | ICD-10-CM | POA: Insufficient documentation

## 2012-12-20 LAB — CBC WITH DIFFERENTIAL/PLATELET
Basophils Absolute: 0 10*3/uL (ref 0.0–0.1)
Basophils Relative: 0 % (ref 0–1)
Eosinophils Absolute: 0 10*3/uL (ref 0.0–0.7)
Eosinophils Relative: 0 % (ref 0–5)
HCT: 37.9 % (ref 36.0–46.0)
Hemoglobin: 12.6 g/dL (ref 12.0–15.0)
MCH: 28.3 pg (ref 26.0–34.0)
MCV: 85 fL (ref 78.0–100.0)
Monocytes Relative: 7 % (ref 3–12)
Neutro Abs: 6.7 10*3/uL (ref 1.7–7.7)
Platelets: 428 10*3/uL — ABNORMAL HIGH (ref 150–400)
RBC: 4.46 MIL/uL (ref 3.87–5.11)
WBC: 10 10*3/uL (ref 4.0–10.5)

## 2012-12-20 LAB — URINALYSIS, ROUTINE W REFLEX MICROSCOPIC
Hgb urine dipstick: NEGATIVE
Protein, ur: NEGATIVE mg/dL
Urobilinogen, UA: 0.2 mg/dL (ref 0.0–1.0)

## 2012-12-20 LAB — BASIC METABOLIC PANEL
Calcium: 9.5 mg/dL (ref 8.4–10.5)
GFR calc non Af Amer: >90 mL/min (ref >90)
Sodium: 137 mEq/L (ref 135–145)

## 2012-12-20 MED ORDER — ONDANSETRON 4 MG PO TBDP: ORAL_TABLET | ORAL | Status: DC

## 2012-12-20 NOTE — ED Provider Notes (Signed)
History    This chart was scribed for [name], MD, by Frederik Pear, ED scribe. The patient was seen in room APA01/APA01 and the patient's care was started at 1522.   CSN: [id number] Arrival date & time 12/20/12  1303  First MD Initiated Contact with Patient 12/20/12 1522     Chief Complaint  Patient presents with  . Abdominal Pain   (Consider location/radiation/quality/duration/timing/severity/associated sxs/prior Treatment) Patient is a 42 y.o. female presenting with abdominal pain. The history is provided by the patient and medical records. No language interpreter was used.  Abdominal Pain This is a new problem. The current episode started more than 2 days ago. The problem occurs constantly. The problem has not changed since onset.Associated symptoms include abdominal pain. Pertinent negatives include no chest pain and no headaches. Nothing aggravates the symptoms. Nothing relieves the symptoms. She has tried nothing for the symptoms.   HPI Comments: Crystal Perry is a 42 y.o. female with a h/o of a hysterectomy in 11/13 who presents to the Emergency Department complaining of moderate, constant bilateral suprapubic pain that is neither exacerbated or alleviated by anything that began 3 days ago.  She reports 2 episodes of emesis with nausea prior to today, but denies either symptom has occurred today. She denies dysuria, diarrhea, and fever. She has a h/o of a hysterectomy in 11/13 after having fibroids.   Past Medical History  Diagnosis Date  . SVD (spontaneous vaginal delivery)     x 1   Past Surgical History  Procedure Laterality Date  . Cyst from breast      right breast  . Wisdom tooth extraction    . Tubes tied    . Dilation and curettage of uterus    . Tubal ligation    . Cystoscopy  03/26/2012    Procedure: CYSTOSCOPY;  Surgeon: Esmeralda Arthur, MD;  Location: [facility];  Service: Gynecology;  Laterality: N/A;  . Robotic assisted supracervical hysterectomy   03/26/12  . Abdominal hysterectomy     Family History  Problem Relation Age of Onset  . Diabetes Maternal Grandmother   . Prostate cancer Maternal Grandfather   . Stroke Mother   . Diabetes Mother   . Hypertension Mother   . Asthma Daughter    History  Substance Use Topics  . Smoking status: Never Smoker   . Smokeless tobacco: Never Used  . Alcohol Use: No   OB History   Grav Para Term Preterm Abortions TAB SAB Ect Mult Living   2 1 1  1  1   1      Review of Systems  Constitutional: Negative for fever, appetite change and fatigue.  HENT: Negative for congestion, sinus pressure and ear discharge.   Eyes: Negative for discharge.  Respiratory: Negative for cough.   Cardiovascular: Negative for chest pain.  Gastrointestinal: Positive for nausea, vomiting and abdominal pain. Negative for diarrhea.  Genitourinary: Negative for dysuria, frequency and hematuria.  Musculoskeletal: Negative for back pain.  Skin: Negative for rash.  Neurological: Negative for seizures and headaches.  Psychiatric/Behavioral: Negative for hallucinations.    Allergies  Bactrim  Home Medications   Current Outpatient Rx  Name  Route  Sig  Dispense  Refill  . ibuprofen (ADVIL,MOTRIN) 600 MG tablet      1 po pc every 6 hours x 5 days then prn   30 tablet   1    BP 135/78  Pulse 81  Temp(Src) 98.9 F (37.2 C) (Oral)  Resp 18  Ht 5\' 3"  (1.6 m)  Wt 203 lb (92.08 kg)  BMI 35.97 kg/m2  SpO2 96%  LMP 03/26/2012 Physical Exam  Nursing note and vitals reviewed. Constitutional: She is oriented to person, place, and time. She appears well-developed.  HENT:  Head: Normocephalic.  Eyes: Conjunctivae and EOM are normal. No scleral icterus.  Neck: Neck supple. No thyromegaly present.  Cardiovascular: Normal rate and regular rhythm.  Exam reveals no gallop and no friction rub.   No murmur heard. Pulmonary/Chest: No stridor. She has no wheezes. She has no rales. She exhibits no tenderness.   Abdominal: Soft. Bowel sounds are normal. She exhibits no distension. There is tenderness in the suprapubic area. There is no rebound.  Mild suprapubic tenderness.   Musculoskeletal: Normal range of motion. She exhibits no edema.  Lymphadenopathy:    She has no cervical adenopathy.  Neurological: She is oriented to person, place, and time. Coordination normal.  Skin: No rash noted. No erythema.  Psychiatric: She has a normal mood and affect. Her behavior is normal.    ED Course  Procedures (including critical care time)  DIAGNOSTIC STUDIES: Oxygen Saturation is 96% on room air, adequate by my interpretation.    COORDINATION OF CARE:  15:35- Discussed planned course of treatment with the patient, including an abdominal X-ray, UA, and blood work, who is agreeable at this time.  16:54 Discussed chest X-ray findings with the pt and discussed having it repeated within the next two week. Discussed normal blood work and UA results. Will discharge with Zofran. PCP is Dr. Garner Nash. OBGYN is Dr. Estanislado Pandy.   Results for orders placed during the hospital encounter of 12/20/12  CBC WITH DIFFERENTIAL      Result Value Range   WBC 10.0  4.0 - 10.5 K/uL   RBC 4.46  3.87 - 5.11 MIL/uL   Hemoglobin 12.6  12.0 - 15.0 g/dL   HCT 16.1  09.6 - 04.5 %   MCV 85.0  78.0 - 100.0 fL   MCH 28.3  26.0 - 34.0 pg   MCHC 33.2  30.0 - 36.0 g/dL   RDW 40.9  81.1 - 91.4 %   Platelets 428 (*) 150 - 400 K/uL   Neutrophils Relative % 67  43 - 77 %   Lymphocytes Relative 26  12 - 46 %   Monocytes Relative 7  3 - 12 %   Eosinophils Relative 0  0 - 5 %   Basophils Relative 0  0 - 1 %   Neutro Abs 6.7  1.7 - 7.7 K/uL   Lymphs Abs 2.6  0.7 - 4.0 K/uL   Monocytes Absolute 0.7  0.1 - 1.0 K/uL   Eosinophils Absolute 0.0  0.0 - 0.7 K/uL   Basophils Absolute 0.0  0.0 - 0.1 K/uL   WBC Morphology ATYPICAL LYMPHOCYTES     Smear Review LARGE PLATELETS PRESENT    BASIC METABOLIC PANEL      Result Value Range   Sodium 137   135 - 145 mEq/L   Potassium 3.8  3.5 - 5.1 mEq/L   Chloride 103  96 - 112 mEq/L   CO2 27  19 - 32 mEq/L   Glucose, Bld 116 (*) 70 - 99 mg/dL   BUN 11  6 - 23 mg/dL   Creatinine, Ser 7.82  0.50 - 1.10 mg/dL   Calcium 9.5  8.4 - 95.6 mg/dL   GFR calc non Af Amer >90  >90 mL/min   GFR calc  Af Amer >90  >90 mL/min  URINALYSIS, ROUTINE W REFLEX MICROSCOPIC      Result Value Range   Color, Urine YELLOW  YELLOW   APPearance CLEAR  CLEAR   Specific Gravity, Urine 1.025  1.005 - 1.030   pH 6.0  5.0 - 8.0   Glucose, UA NEGATIVE  NEGATIVE mg/dL   Hgb urine dipstick NEGATIVE  NEGATIVE   Bilirubin Urine NEGATIVE  NEGATIVE   Ketones, ur NEGATIVE  NEGATIVE mg/dL   Protein, ur NEGATIVE  NEGATIVE mg/dL   Urobilinogen, UA 0.2  0.0 - 1.0 mg/dL   Nitrite NEGATIVE  NEGATIVE   Leukocytes, UA NEGATIVE  NEGATIVE   Labs Reviewed  CBC WITH DIFFERENTIAL - Abnormal; Notable for the following:    Platelets 428 (*)    All other components within normal limits  BASIC METABOLIC PANEL - Abnormal; Notable for the following:    Glucose, Bld 116 (*)    All other components within normal limits  URINALYSIS, ROUTINE W REFLEX MICROSCOPIC   Dg Abd Acute W/chest  12/20/2012   *RADIOLOGY REPORT*  Clinical Data: Nausea, vomiting, and lower abdominal pain for 1 week  ACUTE ABDOMEN SERIES (ABDOMEN 2 VIEW & CHEST 1 VIEW)  Comparison: 03/30/2010  Findings: Normal heart size, mediastinal contours, and pulmonary vascularity. Minimal peribronchial thickening. Minimal volume loss and subsegmental atelectasis at the right base. Questionable nodular density versus prominent vascular marking at right base 7 mm diameter. Lungs clear. No pleural effusion or pneumothorax. Normal bowel gas pattern. No bowel dilatation, bowel wall thickening or free intraperitoneal air. Osseous structures unremarkable. No urinary tract calcification definitely visualized.  IMPRESSION: Minimal chronic peribronchial thickening and right basilar atelectasis.  No acute abdominal findings. Question 7 mm nodular density right lung base versus prominent vascular marking potentially accentuated by minimal right basilar volume loss. Recommend follow-up chest radiographs in 2-4 weeks to ensure resolution.   Original Report Authenticated By: Ulyses Southward, M.D.   No diagnosis found.  MDM  Nausea and suprapubic abd pain.  Gastroenteritis,  Follow up this week  The chart was scribed for me under my direct supervision.  I personally performed the history, physical, and medical decision making and all procedures in the evaluation of this patient.[name], MD 12/20/12 1700

## 2012-12-20 NOTE — ED Notes (Signed)
abd pain, nausea, vomited x2 prior to today, No vomiting today.  No dysuria, No diarrhea, no fever

## 2013-08-05 ENCOUNTER — Ambulatory Visit: Payer: Self-pay | Admitting: Bariatrics

## 2013-08-05 ENCOUNTER — Encounter: Payer: Self-pay | Admitting: Cardiology

## 2013-08-05 DIAGNOSIS — Z01810 Encounter for preprocedural cardiovascular examination: Secondary | ICD-10-CM

## 2013-08-05 LAB — PHOSPHORUS: PHOSPHORUS: 3.1 mg/dL (ref 2.5–4.9)

## 2013-08-05 LAB — IRON AND TIBC
IRON: 94 ug/dL (ref 50–170)
Iron Bind.Cap.(Total): 312 ug/dL (ref 250–450)
Iron Saturation: 30 %
Unbound Iron-Bind.Cap.: 218 ug/dL

## 2013-08-05 LAB — HEPATIC FUNCTION PANEL A (ARMC)
ALK PHOS: 61 U/L
ALT: 18 U/L (ref 12–78)
Albumin: 3.6 g/dL (ref 3.4–5.0)
BILIRUBIN DIRECT: 0.1 mg/dL (ref 0.00–0.20)
Bilirubin,Total: 0.6 mg/dL (ref 0.2–1.0)
SGOT(AST): 18 U/L (ref 15–37)
Total Protein: 7.7 g/dL (ref 6.4–8.2)

## 2013-08-05 LAB — PROTIME-INR
INR: 1
Prothrombin Time: 13.1 secs (ref 11.5–14.7)

## 2013-08-05 LAB — CBC WITH DIFFERENTIAL/PLATELET
BASOS ABS: 0 10*3/uL (ref 0.0–0.1)
Basophil %: 0.5 %
EOS ABS: 0.2 10*3/uL (ref 0.0–0.7)
EOS PCT: 1.6 %
HCT: 36.9 % (ref 35.0–47.0)
HGB: 12.5 g/dL (ref 12.0–16.0)
LYMPHS ABS: 2.9 10*3/uL (ref 1.0–3.6)
Lymphocyte %: 30.1 %
MCH: 29.3 pg (ref 26.0–34.0)
MCHC: 33.8 g/dL (ref 32.0–36.0)
MCV: 87 fL (ref 80–100)
Monocyte #: 0.5 x10 3/mm (ref 0.2–0.9)
Monocyte %: 5.3 %
NEUTROS PCT: 62.5 %
Neutrophil #: 5.9 10*3/uL (ref 1.4–6.5)
Platelet: 382 10*3/uL (ref 150–440)
RBC: 4.26 10*6/uL (ref 3.80–5.20)
RDW: 14.9 % — AB (ref 11.5–14.5)
WBC: 9.5 10*3/uL (ref 3.6–11.0)

## 2013-08-05 LAB — COMPREHENSIVE METABOLIC PANEL
Albumin: 3.6 g/dL (ref 3.4–5.0)
Alkaline Phosphatase: 63 U/L
Anion Gap: 3 — ABNORMAL LOW (ref 7–16)
BUN: 11 mg/dL (ref 7–18)
Bilirubin,Total: 0.6 mg/dL (ref 0.2–1.0)
CO2: 27 mmol/L (ref 21–32)
CREATININE: 0.93 mg/dL (ref 0.60–1.30)
Calcium, Total: 8.6 mg/dL (ref 8.5–10.1)
Chloride: 107 mmol/L (ref 98–107)
EGFR (Non-African Amer.): >60
GLUCOSE: 91 mg/dL (ref 65–99)
Osmolality: 273 (ref 275–301)
POTASSIUM: 3.9 mmol/L (ref 3.5–5.1)
SGOT(AST): 14 U/L — ABNORMAL LOW (ref 15–37)
SGPT (ALT): 17 U/L (ref 12–78)
Sodium: 137 mmol/L (ref 136–145)
TOTAL PROTEIN: 7.5 g/dL (ref 6.4–8.2)

## 2013-08-05 LAB — CALCIUM: Calcium, Total: 8.8 mg/dL (ref 8.5–10.1)

## 2013-08-05 LAB — FERRITIN: FERRITIN (ARMC): 88 ng/mL (ref 8–388)

## 2013-08-05 LAB — APTT: Activated PTT: 30.6 secs (ref 23.6–35.9)

## 2013-08-05 LAB — FOLATE: FOLIC ACID: 11.7 ng/mL (ref 3.1–100.0)

## 2013-08-05 LAB — AMYLASE: Amylase: 61 U/L (ref 25–115)

## 2013-08-05 LAB — TSH: THYROID STIMULATING HORM: 2.33 u[IU]/mL

## 2013-08-05 LAB — HEMOGLOBIN A1C: HEMOGLOBIN A1C: 6.8 % — AB (ref 4.2–6.3)

## 2013-08-05 LAB — LIPASE, BLOOD: Lipase: 109 U/L (ref 73–393)

## 2013-08-05 LAB — MAGNESIUM: Magnesium: 1.9 mg/dL

## 2013-09-06 ENCOUNTER — Ambulatory Visit: Payer: BC Managed Care – PPO | Admitting: Cardiovascular Disease

## 2013-09-14 ENCOUNTER — Encounter: Payer: Self-pay | Admitting: *Deleted

## 2013-09-14 ENCOUNTER — Encounter: Payer: Self-pay | Admitting: Cardiovascular Disease

## 2013-09-14 ENCOUNTER — Ambulatory Visit (INDEPENDENT_AMBULATORY_CARE_PROVIDER_SITE_OTHER): Payer: BC Managed Care – PPO | Admitting: Cardiovascular Disease

## 2013-09-14 VITALS — BP 117/79 | HR 58 | Ht 63.0 in | Wt 216.0 lb

## 2013-09-14 DIAGNOSIS — R9431 Abnormal electrocardiogram [ECG] [EKG]: Secondary | ICD-10-CM

## 2013-09-14 DIAGNOSIS — Z01810 Encounter for preprocedural cardiovascular examination: Secondary | ICD-10-CM

## 2013-09-14 DIAGNOSIS — R001 Bradycardia, unspecified: Secondary | ICD-10-CM

## 2013-09-14 DIAGNOSIS — I498 Other specified cardiac arrhythmias: Secondary | ICD-10-CM

## 2013-09-14 DIAGNOSIS — E119 Type 2 diabetes mellitus without complications: Secondary | ICD-10-CM

## 2013-09-14 NOTE — Progress Notes (Signed)
Patient ID: Crystal Perry, female   DOB: May 13, 1971, 43 y.o.   MRN: [id number]       CARDIOLOGY CONSULT NOTE  Patient ID: Crystal Perry MRN: [id number] DOB/AGE: 12/06/1970 43 y.o.  Admit date: (Not on file) Primary Physician No primary provider on file.  Reason for Consultation: preoperative evaluation  HPI: The patient is a 43 year old woman with a history of uterine fibroids and obesity who has been referred for preoperative risk stratification prior to undergoing bariatric surgery. Her most recent ECG on 08/05/2013 showed sinus bradycardia with sinus arrhythmia, heart rate 48 beats per minute, with a diffuse nonspecific T wave abnormality. It is noted on past medical records that she has a history of an irregular heartbeat. She was diagnosed with diabetes mellitus approximately 2 years ago and has been taking metformin 500 mg twice daily since then. The patient denies any symptoms of chest pain, palpitations, shortness of breath, lightheadedness, dizziness, leg swelling, orthopnea, PND, and syncope. She is being scheduled for a sleep study next week, as she has a history of snoring as well. She had blood test done approximately 2 weeks ago at [facility]. She denies a history of hyperlipidemia. She does not exercise.  The patient denies any symptoms of chest pain, palpitations, shortness of breath, lightheadedness, dizziness, leg swelling, orthopnea, PND, and syncope. She denies a history of smoking. She has stairs at work and is able to climb them without any difficulty. She cleans and vacuums at home without any symptoms. She does a lot of heavy lifting at work and denies any problems with this as well.  Soc: Denies smoking. Works at [employer] cigarettes.    Allergies  Allergen Reactions  . Bactrim [Sulfamethoxazole-Trimethoprim] Hives    Current Outpatient Prescriptions  Medication Sig Dispense Refill  . metFORMIN (GLUCOPHAGE) 500 MG tablet Take 500 mg  by mouth 2 (two) times daily with a meal.       No current facility-administered medications for this visit.    Past Medical History  Diagnosis Date  . SVD (spontaneous vaginal delivery)     x 1    Past Surgical History  Procedure Laterality Date  . Cyst from breast      right breast  . Wisdom tooth extraction    . Tubes tied    . Dilation and curettage of uterus    . Tubal ligation    . Cystoscopy  03/26/2012    Procedure: CYSTOSCOPY;  Surgeon: [name], MD;  Location: [place] [facility];  Service: Gynecology;  Laterality: N/A;  . Robotic assisted supracervical hysterectomy  03/26/12  . Abdominal hysterectomy      History   Social History  . Marital Status: Single    Spouse Name: N/A    Number of Children: N/A  . Years of Education: N/A   Occupational History  . Not on file.   Social History Main Topics  . Smoking status: Never Smoker   . Smokeless tobacco: Never Used  . Alcohol Use: No  . Drug Use: No  . Sexual Activity: Yes    Birth Control/ Protection: Surgical     Comment: BTL / hyst   Other Topics Concern  . Not on file   Social History Narrative  . No narrative on file     No family history of premature CAD in 1st degree relatives.  Prior to Admission medications   Medication Sig Start Date End Date Taking? Authorizing Provider  metFORMIN (GLUCOPHAGE) 500 MG tablet  Take 500 mg by mouth 2 (two) times daily with a meal.   Yes Historical Provider, MD     Review of systems complete and found to be negative unless listed above in HPI     Physical exam Blood pressure 117/79, pulse 58, height 5\' 3"  (1.6 m), weight 216 lb (97.977 kg), last menstrual period 03/26/2012, SpO2 98.00%. General: NAD Neck: No JVD, no thyromegaly or thyroid nodule.  Lungs: Clear to auscultation bilaterally with normal respiratory effort. CV: Nondisplaced PMI.  Heart regular S1/S2, no S3/S4, no murmur.  No peripheral edema.  No carotid bruit.  Normal pedal pulses.  Abdomen:  Soft, nontender, no hepatosplenomegaly, no distention.  Skin: Intact without lesions or rashes.  Neurologic: Alert and oriented x 3.  Psych: Normal affect. Extremities: No clubbing or cyanosis.  HEENT: Normal.   Labs:   Lab Results  Component Value Date   WBC 10.0 12/20/2012   HGB 12.6 12/20/2012   HCT 37.9 12/20/2012   MCV 85.0 12/20/2012   PLT 428* 12/20/2012   No results found for this basename: NA, K, CL, CO2, BUN, CREATININE, CALCIUM, LABALBU, PROT, BILITOT, ALKPHOS, ALT, AST, GLUCOSE,  in the last 168 hours No results found for this basename: CKTOTAL, CKMB, CKMBINDEX, TROPONINI    Lab Results  Component Value Date   CHOL 109 08/07/2007   Lab Results  Component Value Date   HDL 35* 08/07/2007   Lab Results  Component Value Date   LDLCALC 60 08/07/2007   Lab Results  Component Value Date   TRIG 70 08/07/2007   Lab Results  Component Value Date   CHOLHDL 3.1 Ratio 08/07/2007   No results found for this basename: LDLDIRECT         Studies: No results found.  ASSESSMENT AND PLAN: 1. Preoperative risk stratification: Her ECG shows sinus bradycardia with sinus arrhythmia and a nonspecific T wave abnormality. She is entirely asymptomatic. She does not exercise, and is able to achieve between 3-6 metabolic equivalents based on her physical activity at work and home. Her risk factors for ischemic heart disease include obesity and diabetes mellitus. In order to further assess her exercise capacity and to more accurately estimate her perioperative cardiovascular risk, I will proceed with an exercise treadmill stress test as well as an echocardiogram to evaluate for structural heart disease and to assess left ventricular systolic and diastolic function. I will obtain the blood work performed approximately 2 weeks ago to see if lipids were checked, as well as to assess her renal function. I will review sleep study results when they become available. I do not feel cardiac rhythm  monitoring is warranted at this time. Her bradycardia and sinus arrhythmia may very well be due to sleep apnea, although she says that she's had this since childhood.  Dispo: f/u 1 month.   Signed: Kate Sable, M.D., F.A.C.C.  09/14/2013, 8:28 AM

## 2013-09-14 NOTE — Patient Instructions (Signed)
Your physician has requested that you have an echocardiogram. Echocardiography is a painless test that uses sound waves to create images of your heart. It provides your doctor with information about the size and shape of your heart and how well your heart's chambers and valves are working. This procedure takes approximately one hour. There are no restrictions for this procedure. Your physician has requested that you have an exercise tolerance test. For further information please visit [email or web address]. Please also follow instruction sheet, as given. Office will contact with results via phone or letter.   Continue all current medications. Follow up in  1 month   

## 2013-09-21 ENCOUNTER — Other Ambulatory Visit (HOSPITAL_COMMUNITY): Payer: BC Managed Care – PPO

## 2013-09-21 ENCOUNTER — Encounter (HOSPITAL_COMMUNITY): Payer: BC Managed Care – PPO

## 2013-09-22 ENCOUNTER — Ambulatory Visit (HOSPITAL_COMMUNITY)
Admission: RE | Admit: 2013-09-22 | Discharge: 2013-09-22 | Disposition: A | Discharge status: Home / Self Care | Payer: BC Managed Care – PPO | Source: Ambulatory Visit | Attending: Cardiovascular Disease | Admitting: Cardiovascular Disease

## 2013-09-22 ENCOUNTER — Encounter: Payer: Self-pay | Admitting: Cardiology

## 2013-09-22 DIAGNOSIS — R9431 Abnormal electrocardiogram [ECG] [EKG]: Secondary | ICD-10-CM

## 2013-09-22 DIAGNOSIS — Z01810 Encounter for preprocedural cardiovascular examination: Secondary | ICD-10-CM | POA: Insufficient documentation

## 2013-09-22 DIAGNOSIS — Z6838 Body mass index (BMI) 38.0-38.9, adult: Secondary | ICD-10-CM | POA: Insufficient documentation

## 2013-09-22 DIAGNOSIS — I519 Heart disease, unspecified: Secondary | ICD-10-CM

## 2013-09-22 DIAGNOSIS — I498 Other specified cardiac arrhythmias: Secondary | ICD-10-CM | POA: Insufficient documentation

## 2013-09-22 DIAGNOSIS — R001 Bradycardia, unspecified: Secondary | ICD-10-CM

## 2013-09-22 DIAGNOSIS — E669 Obesity, unspecified: Secondary | ICD-10-CM | POA: Insufficient documentation

## 2013-09-22 DIAGNOSIS — E119 Type 2 diabetes mellitus without complications: Secondary | ICD-10-CM

## 2013-09-22 NOTE — Progress Notes (Signed)
*  PRELIMINARY RESULTS* Echocardiogram 2D Echocardiogram has been performed.  Crystal Perry 09/22/2013, 12:30 PM

## 2013-09-22 NOTE — Progress Notes (Signed)
Stress Lab Nurses Notes - Azaylea Maves 09/22/2013 Reason for doing test: Surgical Clearance and Abnormal EKG Type of test: Regular GTX Nurse performing test: Gerrit Halls, RN Nuclear Medicine Tech: Not Applicable Echo Tech: Not Applicable MD performing test: S. McDowell/K.Lawrence NP Family MD: Duke Salvia Test explained and consent signed: yes IV started: No IV started Symptoms: Fatigue Treatment/Intervention: None Reason test stopped: fatigue and reached target HR After recovery IV was: NA Patient to return to [place]. Med at : NA Patient discharged: Home Patient's Condition upon discharge was: stable Comments: During test peak BP 162/125 & HR 166.  Recovery BP 114/76 & HR 99.  Symptoms resolved in recovery. Donnajean Lopes

## 2013-09-22 NOTE — Progress Notes (Signed)
Stress Lab Nurses Notes - [place] Na   ANDILYNN DELAVEGA  09/22/2013  Reason for doing test: Surgical Clearance and Abnormal EKG  Type of test: Regular GTX  Nurse performing test: Gerrit Halls, RN   MD performing test: S. McDowell/K.Purcell Nails NP  Family MD: Duke Salvia  Test explained and consent signed: Yes  IV started: No IV started  Symptoms: Fatigue  Treatment/Intervention: None  Reason test stopped: Fatigue and reached target HR  After recovery IV was: NA  Patient to return to [place]. Med at : NA  Patient discharged: Home  Patient's Condition upon discharge was: Stable  Comments: During test peak BP 162/125 & HR 166. Recovery BP 114/76 & HR 99. Symptoms resolved in recovery.  Donnajean Lopes  Attending note:  Patient exercised on Bruce protocol for 7:13 reaching 10.1 METS and peak heart rate 166 BPM, 93% MPHR. Peak blood pressure 162/125. No chest pain reported. No diagnostic ST segment changes to indicate ischemia. Occasional to frequent PVCs noted early in exercise with subsequent resolution and no sustained arrhythmias. Significant hypertensive response. Duke treadmill score in low risk range otherwise at 7.  Satira Sark, M.D., F.A.C.C.

## 2013-09-27 ENCOUNTER — Telehealth: Payer: Self-pay | Admitting: *Deleted

## 2013-09-27 NOTE — Telephone Encounter (Signed)
Notes Recorded by Laurine Blazer, LPN on 12/04/3149 at 7:61 PM Patient notified and verbalized understanding. Already has follow up scheduled for 10/14/2013 with Dr. Bronson Ing.  ------  Notes Recorded by Laurine Blazer, LPN on 11/13/3708 at 6:26 PM Left message to return call.

## 2013-09-27 NOTE — Telephone Encounter (Signed)
Message copied by Laurine Blazer on Tue Sep 27, 2013  2:20 PM ------      Message from: Kate Sable A      Created: Fri Sep 23, 2013  9:25 AM       Good results. ------

## 2013-10-03 ENCOUNTER — Ambulatory Visit: Payer: Self-pay | Admitting: Bariatrics

## 2013-10-06 ENCOUNTER — Ambulatory Visit: Payer: Self-pay | Admitting: Gastroenterology

## 2013-10-07 ENCOUNTER — Encounter: Payer: Self-pay | Admitting: Neurology

## 2013-10-07 ENCOUNTER — Ambulatory Visit: Payer: Self-pay | Admitting: Bariatrics

## 2013-10-07 ENCOUNTER — Ambulatory Visit: Payer: BC Managed Care – PPO | Attending: Bariatrics | Admitting: Sleep Medicine

## 2013-10-07 DIAGNOSIS — R0609 Other forms of dyspnea: Secondary | ICD-10-CM | POA: Insufficient documentation

## 2013-10-07 DIAGNOSIS — G479 Sleep disorder, unspecified: Secondary | ICD-10-CM | POA: Insufficient documentation

## 2013-10-07 DIAGNOSIS — G4710 Hypersomnia, unspecified: Secondary | ICD-10-CM | POA: Insufficient documentation

## 2013-10-07 DIAGNOSIS — R0989 Other specified symptoms and signs involving the circulatory and respiratory systems: Secondary | ICD-10-CM | POA: Insufficient documentation

## 2013-10-07 DIAGNOSIS — R5383 Other fatigue: Secondary | ICD-10-CM

## 2013-10-07 DIAGNOSIS — R5381 Other malaise: Secondary | ICD-10-CM | POA: Insufficient documentation

## 2013-10-07 DIAGNOSIS — R9401 Abnormal electroencephalogram [EEG]: Secondary | ICD-10-CM | POA: Insufficient documentation

## 2013-10-08 NOTE — Sleep Study (Unsigned)
Split night study ordered,  AHI <15 in 1st 2 hrs TST.  Actually AHI <15 for total study

## 2013-10-14 ENCOUNTER — Ambulatory Visit: Payer: BC Managed Care – PPO | Admitting: Cardiovascular Disease

## 2013-10-16 ENCOUNTER — Emergency Department (HOSPITAL_COMMUNITY)
Admission: EM | Admit: 2013-10-16 | Discharge: 2013-10-16 | Disposition: A | Discharge status: Home / Self Care | Payer: BC Managed Care – PPO | Attending: Emergency Medicine | Admitting: Emergency Medicine

## 2013-10-16 ENCOUNTER — Encounter (HOSPITAL_COMMUNITY): Payer: Self-pay | Admitting: Emergency Medicine

## 2013-10-16 DIAGNOSIS — R21 Rash and other nonspecific skin eruption: Secondary | ICD-10-CM | POA: Insufficient documentation

## 2013-10-16 DIAGNOSIS — R221 Localized swelling, mass and lump, neck: Secondary | ICD-10-CM

## 2013-10-16 DIAGNOSIS — T7840XA Allergy, unspecified, initial encounter: Secondary | ICD-10-CM

## 2013-10-16 DIAGNOSIS — Z79899 Other long term (current) drug therapy: Secondary | ICD-10-CM | POA: Insufficient documentation

## 2013-10-16 DIAGNOSIS — T4995XA Adverse effect of unspecified topical agent, initial encounter: Secondary | ICD-10-CM | POA: Insufficient documentation

## 2013-10-16 DIAGNOSIS — R220 Localized swelling, mass and lump, head: Secondary | ICD-10-CM | POA: Insufficient documentation

## 2013-10-16 MED ORDER — METHYLPREDNISOLONE SODIUM SUCC 125 MG IJ SOLR
125.0000 mg | Freq: Once | INTRAMUSCULAR | Status: AC
  Administered 2013-10-16: 125 mg via INTRAVENOUS
  Filled 2013-10-16: qty 2

## 2013-10-16 MED ORDER — FAMOTIDINE IN NACL 20-0.9 MG/50ML-% IV SOLN
20.0000 mg | Freq: Once | INTRAVENOUS | Status: AC
  Administered 2013-10-16: 20 mg via INTRAVENOUS
  Filled 2013-10-16: qty 50

## 2013-10-16 MED ORDER — FAMOTIDINE 20 MG PO TABS: 20.0000 mg | ORAL_TABLET | Freq: Two times a day (BID) | ORAL | Status: DC

## 2013-10-16 MED ORDER — DIPHENHYDRAMINE HCL 50 MG/ML IJ SOLN
50.0000 mg | Freq: Once | INTRAMUSCULAR | Status: AC
  Administered 2013-10-16: 50 mg via INTRAVENOUS
  Filled 2013-10-16: qty 1

## 2013-10-16 NOTE — ED Provider Notes (Signed)
CSN: [id number]     Arrival date & time 10/16/13  2042 History  This chart was scribed for Crystal Diego, MD by Rolanda Lundborg, ED Scribe. This patient was seen in room APA08/APA08 and the patient's care was started at 9:01 PM.    Chief Complaint  Patient presents with  . Allergic Reaction   Patient is a 43 y.o. female presenting with allergic reaction. The history is provided by the patient. No language interpreter was used.  Allergic Reaction Presenting symptoms: itching, rash and swelling   Swelling:    Location:  Face Severity:  Mild Prior allergic episodes:  No prior episodes Context: no food allergies and no medications   Relieved by:  Nothing Worsened by:  Nothing tried Ineffective treatments:  Antihistamines  HPI Comments: Crystal Perry is a 43 y.o. female who presents to the Emergency Department complaining of allergic reaction to unknown substance onset yesterday morning. She states it started as itchy full-body hives. She was seen at [facility] ED yesterday, given benadryl shot and a steroid shot and discharged home. She states the hives resolved but after she got home she started having facial and lip swelling, so she returned to [facility] ED yesterday afternoon and was given IV Pepcid and a prescription for prednisone, which she started today. She states the facial and lip swelling returned today. She took benadryl PTA with no relief. She denies doing or eating anything out of the usual the last few days.  Past Medical History  Diagnosis Date  . SVD (spontaneous vaginal delivery)     x 1   Past Surgical History  Procedure Laterality Date  . Cyst from breast      right breast  . Wisdom tooth extraction    . Tubes tied    . Dilation and curettage of uterus    . Tubal ligation    . Cystoscopy  03/26/2012    Procedure: CYSTOSCOPY;  Surgeon: [name], MD;  Location: [place] [facility];  Service: Gynecology;  Laterality: N/A;  . Robotic assisted supracervical hysterectomy   03/26/12  . Abdominal hysterectomy     Family History  Problem Relation Age of Onset  . Diabetes Maternal Grandmother   . Prostate cancer Maternal Grandfather   . Stroke Mother   . Diabetes Mother   . Hypertension Mother   . Asthma Daughter    History  Substance Use Topics  . Smoking status: Never Smoker   . Smokeless tobacco: Never Used  . Alcohol Use: No   OB History   Grav Para Term Preterm Abortions TAB SAB Ect Mult Living   2 1 1  1  1   1      Review of Systems  Constitutional: Negative for appetite change and fatigue.  HENT: Positive for facial swelling. Negative for congestion, ear discharge and sinus pressure.   Eyes: Negative for discharge.  Respiratory: Negative for cough.   Cardiovascular: Negative for chest pain.  Gastrointestinal: Negative for abdominal pain and diarrhea.  Genitourinary: Negative for frequency and hematuria.  Musculoskeletal: Negative for back pain.  Skin: Positive for itching and rash.  Neurological: Negative for seizures and headaches.  Psychiatric/Behavioral: Negative for hallucinations.      Allergies  Bactrim  Home Medications   Prior to Admission medications   Medication Sig Start Date End Date Taking? Authorizing Provider  metFORMIN (GLUCOPHAGE) 500 MG tablet Take 500 mg by mouth 2 (two) times daily with a meal.    Historical Provider, MD   BP  156/85  Pulse 97  Temp(Src) 98.6 F (37 C) (Oral)  Resp 20  Ht 5\' 3"  (1.6 m)  Wt 213 lb (96.616 kg)  BMI 37.74 kg/m2  SpO2 98%  LMP 03/26/2012 Physical Exam  Constitutional: She is oriented to person, place, and time. She appears well-developed.  HENT:  Head: Normocephalic.  Mild swelling to upper lip  Eyes: Conjunctivae and EOM are normal. No scleral icterus.  Neck: Neck supple. No thyromegaly present.  Cardiovascular: Normal rate and regular rhythm.  Exam reveals no gallop and no friction rub.   No murmur heard. Pulmonary/Chest: No stridor. She has no wheezes. She has no  rales. She exhibits no tenderness.  Abdominal: She exhibits no distension. There is no tenderness. There is no rebound.  Musculoskeletal: Normal range of motion. She exhibits no edema.  Lymphadenopathy:    She has no cervical adenopathy.  Neurological: She is oriented to person, place, and time. She exhibits normal muscle tone. Coordination normal.  Skin: No rash noted. No erythema.  Psychiatric: She has a normal mood and affect. Her behavior is normal.    ED Course  Procedures (including critical care time) Medications  diphenhydrAMINE (BENADRYL) injection 50 mg (not administered)  methylPREDNISolone sodium succinate (SOLU-MEDROL) 125 mg/2 mL injection 125 mg (not administered)  famotidine (PEPCID) IVPB 20 mg (not administered)    DIAGNOSTIC STUDIES: Oxygen Saturation is 98% on RA, normal by my interpretation.    COORDINATION OF CARE: 9:13 PM- Discussed treatment plan with pt which includes IV pepcid, benadryl shot, and solumedrol shot. Pt agrees to plan.    Labs Review Labs Reviewed - No data to display  Imaging Review No results found.   EKG Interpretation None     Pts swlling improved some MDM   Final diagnoses:  None    The chart was scribed for me under my direct supervision.  I personally performed the history, physical, and medical decision making and all procedures in the evaluation of this patient.Crystal Diego, MD 10/16/13 2242

## 2013-10-16 NOTE — Discharge Instructions (Signed)
Take benadryl 25 mg every 4 hours for rash or swelling.  Follow up with your md in 2-3 days

## 2013-10-16 NOTE — ED Notes (Signed)
Pts face is still swollen.

## 2013-10-16 NOTE — ED Notes (Signed)
Pt starting having hives yesterday, seen in ED at [facility], given Benadryl and Steroid shot, went home started having facial and lip swelling, returned to [facility] ED and given IV Pepcid and prescription for Prednisone.  Started prescription for Prednisone today and facial and lip swelling has returned.

## 2013-10-17 ENCOUNTER — Encounter: Payer: Self-pay | Admitting: Cardiovascular Disease

## 2013-10-17 ENCOUNTER — Ambulatory Visit (INDEPENDENT_AMBULATORY_CARE_PROVIDER_SITE_OTHER): Payer: BC Managed Care – PPO | Admitting: Cardiovascular Disease

## 2013-10-17 ENCOUNTER — Encounter: Payer: Self-pay | Admitting: *Deleted

## 2013-10-17 VITALS — BP 121/82 | HR 78 | Ht 63.0 in | Wt 241.0 lb

## 2013-10-17 DIAGNOSIS — Z01818 Encounter for other preprocedural examination: Secondary | ICD-10-CM

## 2013-10-17 NOTE — Progress Notes (Signed)
Patient ID: Crystal Perry, female   DOB: 1970-08-19, 43 y.o.   MRN: [id number]      SUBJECTIVE: The patient returns for followup of cardiovascular testing she underwent for preoperative risk stratification prior to undergoing bariatric surgery. Her echocardiogram demonstrated normal left ventricular systolic function, EF 76-19%, with normal wall thickness and grade 1 diastolic dysfunction. There was no significant valvular pathology. Exercise treadmill stress testing also demonstrated good results and was deemed low risk. She exercised utilizing the Bruce protocol for 7:13 reaching 10.1 METS. She did have a hypertensive response. She was seen yesterday in the emergency room for an allergic reaction and she is due to followup with an allergist. It occurred 30 minutes after eating a slice of cheese pizza.    Allergies  Allergen Reactions  . Bactrim [Sulfamethoxazole-Trimethoprim] Hives    Current Outpatient Prescriptions  Medication Sig Dispense Refill  . famotidine (PEPCID) 20 MG tablet Take 1 tablet (20 mg total) by mouth 2 (two) times daily.  14 tablet  0  . metFORMIN (GLUCOPHAGE) 500 MG tablet Take 500 mg by mouth 2 (two) times daily with a meal.      . predniSONE (DELTASONE) 20 MG tablet Take 40 mg by mouth daily.      Marland Kitchen triamcinolone cream (KENALOG) 0.1 % Apply 1 application topically daily as needed. Allergic reaction       No current facility-administered medications for this visit.    Past Medical History  Diagnosis Date  . SVD (spontaneous vaginal delivery)     x 1    Past Surgical History  Procedure Laterality Date  . Cyst from breast      right breast  . Wisdom tooth extraction    . Tubes tied    . Dilation and curettage of uterus    . Tubal ligation    . Cystoscopy  03/26/2012    Procedure: CYSTOSCOPY;  Surgeon: [name], MD;  Location: [facility];  Service: Gynecology;  Laterality: N/A;  . Robotic assisted supracervical hysterectomy  03/26/12  . Abdominal  hysterectomy      History   Social History  . Marital Status: Divorced    Spouse Name: N/A    Number of Children: N/A  . Years of Education: N/A   Occupational History  . Not on file.   Social History Main Topics  . Smoking status: Never Smoker   . Smokeless tobacco: Never Used  . Alcohol Use: No  . Drug Use: No  . Sexual Activity: Yes    Birth Control/ Protection: Surgical     Comment: BTL / hyst   Other Topics Concern  . Not on file   Social History Narrative  . No narrative on file      PHYSICAL EXAM General: NAD Neck: No JVD, no thyromegaly. Lungs: Clear to auscultation bilaterally with normal respiratory effort. CV: Nondisplaced PMI.  Regular rate and rhythm, normal S1/S2, no S3/S4, no murmur. No pretibial or periankle edema.  No carotid bruit.  Normal pedal pulses.  Abdomen: Soft, nontender, no hepatosplenomegaly, no distention.  Neurologic: Alert and oriented x 3.  Psych: Normal affect. Extremities: No clubbing or cyanosis.   ECG: reviewed and available in electronic records.   GXT: Patient exercised on Bruce protocol for 7:13 reaching 10.1 METS and peak heart rate 166 BPM, 93% MPHR. Peak blood pressure 162/125. No chest pain reported. No diagnostic ST segment changes to indicate ischemia. Occasional to frequent PVCs noted early in exercise with subsequent resolution and no  sustained arrhythmias. Significant hypertensive response. Duke treadmill score in low risk range otherwise at 7.    ASSESSMENT AND PLAN: 1. Preoperative risk stratification: Given the results of cardiac testing including echocardiography and exercise treadmill stress testing, I feel she is at a low risk for major adverse cardiac events in the perioperative period. I feel she can safely proceed with bariatric surgery.  Dispo: f/u prn.  Kate Sable, M.D., F.A.C.C.

## 2013-10-17 NOTE — Patient Instructions (Signed)
Continue all current medications. Follow up as needed  

## 2013-10-19 NOTE — Sleep Study (Signed)
  [place] A. [name], MD     [email or web address]        NOCTURNAL POLYSOMNOGRAM    LOCATION: SLEEP LAB FACILITY: [facility]   PHYSICIAN: Geetika Laborde A. [name], M.D.   DATE OF STUDY: 10/07/2013.   REFERRING PHYSICIAN: Arletta Bale, M.D.  INDICATIONS: This is a 43 year old presents with hypersomnia, fatigue, restless sleep and snoring.  MEDICATIONS:  Prior to Admission medications   Medication Sig Start Date End Date Taking? Authorizing Provider  famotidine (PEPCID) 20 MG tablet Take 1 tablet (20 mg total) by mouth 2 (two) times daily. 10/16/13   Maudry Diego, MD  metFORMIN (GLUCOPHAGE) 500 MG tablet Take 500 mg by mouth 2 (two) times daily with a meal.    Historical Provider, MD  predniSONE (DELTASONE) 20 MG tablet Take 40 mg by mouth daily. 10/15/13   Historical Provider, MD  triamcinolone cream (KENALOG) 0.1 % Apply 1 application topically daily as needed. Allergic reaction 10/15/13   Historical Provider, MD      EPWORTH SLEEPINESS SCALE: 6.   BMI: 38.   ARCHITECTURAL SUMMARY: Total recording time was 410 minutes. Sleep efficiency 86 %. Sleep latency 15 minutes. REM latency 50 minutes. Stage NI 5 %, N2 72 % and N3 0.5 % and REM sleep 22 %.    RESPIRATORY DATA:  Baseline oxygen saturation is 98 %. The lowest saturation is 92 %. The diagnostic AHI is 1. The RDI is 1. The REM AHI is 1.  LIMB MOVEMENT SUMMARY: PLM index 1.   ELECTROCARDIOGRAM SUMMARY: Average heart rate is 72 with no significant dysrhythmias observed.   IMPRESSION:  1. Abnormal sleep architecture with early onset REM latency. This is typically seen with a REM rebound phenomena such as withdrawal from antidepressants and stimulants. They can also be seen in narcolepsy.  Thanks for this referral.  Philemon Riedesel A. [name], M.D. Diplomat, Tax adviser of Sleep Medicine.

## 2013-11-18 ENCOUNTER — Encounter (HOSPITAL_COMMUNITY): Payer: Self-pay | Admitting: Emergency Medicine

## 2013-11-18 ENCOUNTER — Emergency Department (HOSPITAL_COMMUNITY)
Admission: EM | Admit: 2013-11-18 | Discharge: 2013-11-18 | Disposition: A | Discharge status: Home / Self Care | Payer: BC Managed Care – PPO | Attending: Emergency Medicine | Admitting: Emergency Medicine

## 2013-11-18 DIAGNOSIS — L509 Urticaria, unspecified: Secondary | ICD-10-CM | POA: Insufficient documentation

## 2013-11-18 HISTORY — DX: Urticaria, unspecified: L50.9

## 2013-11-18 MED ORDER — METHYLPREDNISOLONE SODIUM SUCC 125 MG IJ SOLR
125.0000 mg | Freq: Once | INTRAMUSCULAR | Status: AC
  Administered 2013-11-18: 125 mg via INTRAVENOUS
  Filled 2013-11-18: qty 2

## 2013-11-18 MED ORDER — PREDNISONE 20 MG PO TABS: ORAL_TABLET | ORAL | Status: DC

## 2013-11-18 MED ORDER — EPINEPHRINE 0.3 MG/0.3ML IJ SOAJ: 0.3000 mg | Freq: Once | INTRAMUSCULAR | Status: DC

## 2013-11-18 MED ORDER — FAMOTIDINE IN NACL 20-0.9 MG/50ML-% IV SOLN
20.0000 mg | Freq: Once | INTRAVENOUS | Status: AC
  Administered 2013-11-18: 20 mg via INTRAVENOUS
  Filled 2013-11-18: qty 50

## 2013-11-18 MED ORDER — SODIUM CHLORIDE 0.9 % IV SOLN
INTRAVENOUS | Status: DC
  Administered 2013-11-18: 19:00:00 via INTRAVENOUS

## 2013-11-18 MED ORDER — DIPHENHYDRAMINE HCL 50 MG/ML IJ SOLN
50.0000 mg | Freq: Once | INTRAMUSCULAR | Status: AC
  Administered 2013-11-18: 50 mg via INTRAVENOUS
  Filled 2013-11-18: qty 1

## 2013-11-18 NOTE — Discharge Instructions (Signed)

## 2013-11-18 NOTE — ED Provider Notes (Signed)
CSN: [id number]     Arrival date & time 11/18/13  1748 History   This chart was scribed for Orpah Greek, MD by Ladene Artist, ED Scribe. The patient was seen in room APA06/APA06. Patient's care was started at 6:10 PM.   Chief Complaint  Patient presents with  . Urticaria   The history is provided by the patient. No language interpreter was used.   HPI Comments: Crystal Perry is a 43 y.o. female who presents to the Emergency Department complaining of urticarial rash on bilateral arms and abdomen onset approximately 1 hour ago. She denies SOB or trouble swallowing. Pt reports same symptoms before. She followed up with an allergist with negative results. Pt does not have an epipen. She denies taking Benadryl PTA.  Past Medical History  Diagnosis Date  . SVD (spontaneous vaginal delivery)     x 1  . Hives    Past Surgical History  Procedure Laterality Date  . Cyst from breast      right breast  . Wisdom tooth extraction    . Tubes tied    . Dilation and curettage of uterus    . Tubal ligation    . Cystoscopy  03/26/2012    Procedure: CYSTOSCOPY;  Surgeon: [name], MD;  Location: [facility];  Service: Gynecology;  Laterality: N/A;  . Robotic assisted supracervical hysterectomy  03/26/12  . Abdominal hysterectomy     Family History  Problem Relation Age of Onset  . Diabetes Maternal Grandmother   . Prostate cancer Maternal Grandfather   . Stroke Mother   . Diabetes Mother   . Hypertension Mother   . Asthma Daughter    History  Substance Use Topics  . Smoking status: Never Smoker   . Smokeless tobacco: Never Used  . Alcohol Use: No   OB History   Grav Para Term Preterm Abortions TAB SAB Ect Mult Living   2 1 1  1  1   1      Review of Systems  HENT: Negative for trouble swallowing.   Respiratory: Negative for shortness of breath.   Skin: Positive for rash.  All other systems reviewed and are negative.  Allergies  Bactrim  Home Medications   Prior to  Admission medications   Medication Sig Start Date End Date Taking? Authorizing Provider  metFORMIN (GLUCOPHAGE) 500 MG tablet Take 500 mg by mouth at bedtime.    Yes Historical Provider, MD  EPINEPHrine (EPIPEN 2-PAK) 0.3 mg/0.3 mL IJ SOAJ injection Inject 0.3 mLs (0.3 mg total) into the muscle once. 11/18/13   Orpah Greek, MD  predniSONE (DELTASONE) 20 MG tablet 3 tabs po daily x 3 days, then 2 tabs x 3 days, then 1.5 tabs x 3 days, then 1 tab x 3 days, then 0.5 tabs x 3 days 11/18/13   Orpah Greek, MD   Triage Vitals: BP 134/93  Pulse 46  Temp(Src) 97.9 F (36.6 C) (Oral)  Resp 16  Ht 5\' 3"  (1.6 m)  Wt 208 lb (94.348 kg)  BMI 36.85 kg/m2  SpO2 98%  LMP 03/26/2012 Physical Exam  Constitutional: She is oriented to person, place, and time. She appears well-developed and well-nourished.  Neurological: She is alert and oriented to person, place, and time. No cranial nerve deficit. Coordination normal.  Skin: Skin is warm and dry. Rash noted.  Diffuse hives on torso and arms  Psychiatric: She has a normal mood and affect. Her behavior is normal.   ED Course  Procedures (including critical care time) DIAGNOSTIC STUDIES: Oxygen Saturation is 98% on RA, normal by my interpretation.    COORDINATION OF CARE: 6:12 PM Discussed treatment plan with pt at bedside and pt agreed to plan.  Labs Review Labs Reviewed - No data to display  Imaging Review No results found.   EKG Interpretation None      MDM   Final diagnoses:  Urticaria    Presents to the ER for evaluation of urticaria. Patient has had previous outbreaks of urticaria and has had skin testing by an allergist without identifying a trigger. She has not taken any medication before arrival. Her hives are significantly improved after Benadryl, Pepcid and Cymetra. She was observed without any airway involvement. Patient was instructed to take Benadryl every 6 hours for the next 2 days, then as needed. She was  prescribed a prednisone taper. Because she has had multiple episodes now, also prescribed an EpiPen and instructed on how and when to use.  I personally performed the services described in this documentation, which was scribed in my presence. The recorded information has been reviewed and is accurate.    Orpah Greek, MD 11/18/13 [phone]

## 2013-11-18 NOTE — ED Notes (Signed)
Uritacarial rash,  Onset app 1 hour ago,  No resp distress or problem swallowing.  Seen here for same and has had allergy testing.

## 2014-04-10 ENCOUNTER — Encounter (HOSPITAL_COMMUNITY): Payer: Self-pay | Admitting: Emergency Medicine

## 2015-04-13 DIAGNOSIS — Z90711 Acquired absence of uterus with remaining cervical stump: Secondary | ICD-10-CM | POA: Insufficient documentation

## 2015-04-28 DIAGNOSIS — R87612 Low grade squamous intraepithelial lesion on cytologic smear of cervix (LGSIL): Secondary | ICD-10-CM | POA: Insufficient documentation

## 2015-11-10 DIAGNOSIS — G43019 Migraine without aura, intractable, without status migrainosus: Secondary | ICD-10-CM | POA: Insufficient documentation

## 2015-11-10 DIAGNOSIS — N6310 Unspecified lump in the right breast, unspecified quadrant: Secondary | ICD-10-CM | POA: Insufficient documentation

## 2015-11-10 DIAGNOSIS — N83202 Unspecified ovarian cyst, left side: Secondary | ICD-10-CM | POA: Insufficient documentation

## 2018-05-19 ENCOUNTER — Other Ambulatory Visit: Payer: Self-pay

## 2018-05-19 ENCOUNTER — Encounter (HOSPITAL_COMMUNITY): Payer: Self-pay | Admitting: Emergency Medicine

## 2018-05-19 ENCOUNTER — Emergency Department (HOSPITAL_COMMUNITY)
Admission: EM | Admit: 2018-05-19 | Discharge: 2018-05-19 | Disposition: A | Discharge status: Home / Self Care | Payer: Managed Care, Other (non HMO) | Attending: Emergency Medicine | Admitting: Emergency Medicine

## 2018-05-19 DIAGNOSIS — F17210 Nicotine dependence, cigarettes, uncomplicated: Secondary | ICD-10-CM | POA: Insufficient documentation

## 2018-05-19 DIAGNOSIS — Z7984 Long term (current) use of oral hypoglycemic drugs: Secondary | ICD-10-CM | POA: Diagnosis not present

## 2018-05-19 DIAGNOSIS — R112 Nausea with vomiting, unspecified: Secondary | ICD-10-CM

## 2018-05-19 DIAGNOSIS — H9201 Otalgia, right ear: Secondary | ICD-10-CM

## 2018-05-19 DIAGNOSIS — R197 Diarrhea, unspecified: Secondary | ICD-10-CM | POA: Insufficient documentation

## 2018-05-19 DIAGNOSIS — R1110 Vomiting, unspecified: Secondary | ICD-10-CM | POA: Diagnosis present

## 2018-05-19 LAB — URINALYSIS, ROUTINE W REFLEX MICROSCOPIC
BILIRUBIN URINE: NEGATIVE
Glucose, UA: NEGATIVE mg/dL
Hgb urine dipstick: NEGATIVE
Ketones, ur: NEGATIVE mg/dL
Leukocytes, UA: NEGATIVE
Nitrite: NEGATIVE
PROTEIN: NEGATIVE mg/dL
SPECIFIC GRAVITY, URINE: 1.002 — AB (ref 1.005–1.030)
pH: 6 (ref 5.0–8.0)

## 2018-05-19 LAB — COMPREHENSIVE METABOLIC PANEL
ALK PHOS: 63 U/L (ref 38–126)
ALT: 15 U/L (ref 0–44)
AST: 14 U/L — AB (ref 15–41)
Albumin: 4 g/dL (ref 3.5–5.0)
Anion gap: 9 (ref 5–15)
BUN: 14 mg/dL (ref 6–20)
CO2: 23 mmol/L (ref 22–32)
Calcium: 9.3 mg/dL (ref 8.9–10.3)
Chloride: 107 mmol/L (ref 98–111)
Creatinine, Ser: 0.85 mg/dL (ref 0.44–1.00)
GFR calc non Af Amer: >60 mL/min (ref >60)
Glucose, Bld: 103 mg/dL — ABNORMAL HIGH (ref 70–99)
POTASSIUM: 4 mmol/L (ref 3.5–5.1)
Sodium: 139 mmol/L (ref 135–145)
TOTAL PROTEIN: 7.6 g/dL (ref 6.5–8.1)
Total Bilirubin: 0.5 mg/dL (ref 0.3–1.2)

## 2018-05-19 LAB — CBC
HCT: 43.1 % (ref 36.0–46.0)
HEMOGLOBIN: 13.6 g/dL (ref 12.0–15.0)
MCH: 28.3 pg (ref 26.0–34.0)
MCHC: 31.6 g/dL (ref 30.0–36.0)
MCV: 89.8 fL (ref 80.0–100.0)
NRBC: 0 % (ref 0.0–0.2)
Platelets: 455 10*3/uL — ABNORMAL HIGH (ref 150–400)
RBC: 4.8 MIL/uL (ref 3.87–5.11)
RDW: 14.8 % (ref 11.5–15.5)
WBC: 8.9 10*3/uL (ref 4.0–10.5)

## 2018-05-19 LAB — LIPASE, BLOOD: LIPASE: 28 U/L (ref 11–51)

## 2018-05-19 MED ORDER — LOPERAMIDE HCL 2 MG PO CAPS: 2.0000 mg | ORAL_CAPSULE | Freq: Four times a day (QID) | ORAL | 0 refills | Status: DC | PRN

## 2018-05-19 MED ORDER — ONDANSETRON HCL 4 MG PO TABS: 4.0000 mg | ORAL_TABLET | Freq: Three times a day (TID) | ORAL | 0 refills | Status: DC | PRN

## 2018-05-19 NOTE — ED Triage Notes (Signed)
Patient complaining of vomiting and diarrhea x 3 days. Also complaining of right ear pain.

## 2018-05-19 NOTE — Discharge Instructions (Addendum)
You were evaluated in the emergency department for nausea vomiting and diarrhea.  Your lab work did not show an obvious cause of your symptoms.  We are sending you home with a prescription for some nausea medicine and antidiarrhea medicine. It will be important for you to stay well-hydrated.  Please follow-up with your doctor and return if any worsening symptoms.

## 2018-05-19 NOTE — ED Provider Notes (Signed)
[facility] EMERGENCY DEPARTMENT Provider Note   CSN: [id number] Arrival date & time: 05/19/18  1411     History   Chief Complaint Chief Complaint  Patient presents with  . Emesis    HPI Crystal Perry is a 47 y.o. female.  She does not have any significant medical history.  She is complaining of 1 week of right ear pain.  She started with diarrhea about 3 days ago 2 or 3 episodes a day nonbloody and then since last evening has been vomiting and unable to keep anything down.  She has had some chills but no fever.  No chest pain no abdominal pain.  Little bit of a dry cough.  She said she is been urinating frequently.  No sick contacts or recent travel.  She is tried some Pepto-Bismol without any relief.  The history is provided by the patient.  Emesis   This is a new problem. The current episode started yesterday. The problem occurs 2 to 4 times per day. The problem has not changed since onset.The emesis has an appearance of stomach contents. There has been no fever. Associated symptoms include chills, cough and diarrhea. Pertinent negatives include no abdominal pain, no arthralgias, no fever, no headaches and no myalgias.    Past Medical History:  Diagnosis Date  . Hives   . SVD (spontaneous vaginal delivery)    x 1    Patient Active Problem List   Diagnosis Date Noted  . Fibroids 03/03/2012  . OBESITY 08/06/2007  . LEUKOCYTOSIS 08/06/2007  . HIATAL HERNIA 08/06/2007  . LYMPHADENOPATHY, DIFFUSE 08/06/2007    Past Surgical History:  Procedure Laterality Date  . ABDOMINAL HYSTERECTOMY    . cyst from breast     right breast  . CYSTOSCOPY  03/26/2012   Procedure: CYSTOSCOPY;  Surgeon: [name], MD;  Location: [facility];  Service: Gynecology;  Laterality: N/A;  . DILATION AND CURETTAGE OF UTERUS    . ROBOTIC ASSISTED SUPRACERVICAL HYSTERECTOMY  03/26/12  . TUBAL LIGATION    . tubes tied    . WISDOM TOOTH EXTRACTION       OB History    Gravida  2   Para  1   Term  1   Preterm      AB  1   Living  1     SAB  1   TAB      Ectopic      Multiple      Live Births  1            Home Medications    Prior to Admission medications   Medication Sig Start Date End Date Taking? Authorizing Provider  EPINEPHrine (EPIPEN 2-PAK) 0.3 mg/0.3 mL IJ SOAJ injection Inject 0.3 mLs (0.3 mg total) into the muscle once. 11/18/13   Orpah Greek, MD  metFORMIN (GLUCOPHAGE) 500 MG tablet Take 500 mg by mouth at bedtime.     [provider]  predniSONE (DELTASONE) 20 MG tablet 3 tabs po daily x 3 days, then 2 tabs x 3 days, then 1.5 tabs x 3 days, then 1 tab x 3 days, then 0.5 tabs x 3 days 11/18/13   Orpah Greek, MD    Family History Family History  Problem Relation Age of Onset  . Diabetes Maternal Grandmother   . Prostate cancer Maternal Grandfather   . Stroke Mother   . Diabetes Mother   . Hypertension Mother   . Asthma Daughter  Social History Social History   Tobacco Use  . Smoking status: Current Every Day Smoker  . Smokeless tobacco: Never Used  Substance Use Topics  . Alcohol use: No  . Drug use: No     Allergies   Bactrim [sulfamethoxazole-trimethoprim]   Review of Systems Review of Systems  Constitutional: Positive for chills. Negative for fever.  HENT: Positive for ear pain. Negative for sore throat.   Eyes: Negative for visual disturbance.  Respiratory: Positive for cough. Negative for shortness of breath.   Cardiovascular: Negative for chest pain.  Gastrointestinal: Positive for diarrhea and vomiting. Negative for abdominal pain.  Genitourinary: Positive for frequency. Negative for dysuria, vaginal bleeding and vaginal discharge.  Musculoskeletal: Negative for arthralgias and myalgias.  Skin: Negative for rash.  Neurological: Negative for headaches.     Physical Exam Updated Vital Signs BP (!) 128/99 (BP Location: Right Arm)   Pulse (!) 59   Temp 98.4 F (36.9 C) (Temporal)    Resp 18   Ht 5\' 3"  (1.6 m)   Wt 86.2 kg   LMP 03/26/2012   SpO2 100%   BMI 33.66 kg/m   Physical Exam  Constitutional: She appears well-developed and well-nourished. No distress.  HENT:  Head: Normocephalic and atraumatic.  Right Ear: Tympanic membrane normal.  Left Ear: Tympanic membrane normal.  Eyes: Conjunctivae are normal.  Neck: Neck supple.  Cardiovascular: Normal rate, regular rhythm and normal heart sounds.  No murmur heard. Pulmonary/Chest: Effort normal and breath sounds normal. No respiratory distress. She has no wheezes.  Abdominal: Soft. She exhibits no mass. There is no tenderness. There is no guarding.  Musculoskeletal: She exhibits no edema, tenderness or deformity.  Neurological: She is alert. Gait normal. GCS eye subscore is 4. GCS verbal subscore is 5. GCS motor subscore is 6.  Skin: Skin is warm and dry.  Psychiatric: She has a normal mood and affect.  Nursing note and vitals reviewed.    ED Treatments / Results  Labs (all labs ordered are listed, but only abnormal results are displayed) Labs Reviewed  COMPREHENSIVE METABOLIC PANEL - Abnormal; Notable for the following components:      Result Value   Glucose, Bld 103 (*)    AST 14 (*)    All other components within normal limits  CBC - Abnormal; Notable for the following components:   Platelets 455 (*)    All other components within normal limits  URINALYSIS, ROUTINE W REFLEX MICROSCOPIC - Abnormal; Notable for the following components:   Color, Urine STRAW (*)    Specific Gravity, Urine 1.002 (*)    All other components within normal limits  LIPASE, BLOOD    EKG None  Radiology No results found.  Procedures Procedures (including critical care time)  Medications Ordered in ED Medications - No data to display   Initial Impression / Assessment and Plan / ED Course  I have reviewed the triage vital signs and the nursing notes.  Pertinent labs & imaging results that were available  during my care of the patient were reviewed by me and considered in my medical decision making (see chart for details).  Clinical Course as of May 19 2201  Wed May 19, 2018  1730 Patient's lab work fairly unremarkable.  Exam is also benign.  I offered that we complete an IV and give her some fluids and some nausea medicine but she would rather skip the needle and just go home.  I will send her home with a prescription for  some Zofran and some Imodium.   [MB]    Clinical Course User Index [MB] Hayden Rasmussen, MD    Final Clinical Impressions(s) / ED Diagnoses   Final diagnoses:  Nausea vomiting and diarrhea  Right ear pain    ED Discharge Orders         Ordered    ondansetron (ZOFRAN) 4 MG tablet  Every 8 hours PRN     05/19/18 1731    loperamide (IMODIUM) 2 MG capsule  4 times daily PRN     05/19/18 1731           Hayden Rasmussen, MD 05/19/18 2203

## 2018-08-30 ENCOUNTER — Emergency Department (HOSPITAL_COMMUNITY): Payer: BLUE CROSS/BLUE SHIELD

## 2018-08-30 ENCOUNTER — Other Ambulatory Visit: Payer: Self-pay

## 2018-08-30 ENCOUNTER — Encounter (HOSPITAL_COMMUNITY): Payer: Self-pay | Admitting: Emergency Medicine

## 2018-08-30 ENCOUNTER — Emergency Department (HOSPITAL_COMMUNITY)
Admission: EM | Admit: 2018-08-30 | Discharge: 2018-08-30 | Disposition: A | Discharge status: Home / Self Care | Payer: BLUE CROSS/BLUE SHIELD | Attending: Emergency Medicine | Admitting: Emergency Medicine

## 2018-08-30 DIAGNOSIS — R059 Cough, unspecified: Secondary | ICD-10-CM

## 2018-08-30 DIAGNOSIS — R05 Cough: Secondary | ICD-10-CM | POA: Diagnosis not present

## 2018-08-30 DIAGNOSIS — R0602 Shortness of breath: Secondary | ICD-10-CM

## 2018-08-30 DIAGNOSIS — R509 Fever, unspecified: Secondary | ICD-10-CM | POA: Diagnosis not present

## 2018-08-30 DIAGNOSIS — Z87891 Personal history of nicotine dependence: Secondary | ICD-10-CM | POA: Diagnosis not present

## 2018-08-30 DIAGNOSIS — Z7984 Long term (current) use of oral hypoglycemic drugs: Secondary | ICD-10-CM | POA: Insufficient documentation

## 2018-08-30 DIAGNOSIS — Z20828 Contact with and (suspected) exposure to other viral communicable diseases: Secondary | ICD-10-CM | POA: Diagnosis not present

## 2018-08-30 DIAGNOSIS — Z20822 Contact with and (suspected) exposure to covid-19: Secondary | ICD-10-CM

## 2018-08-30 DIAGNOSIS — Z1159 Encounter for screening for other viral diseases: Secondary | ICD-10-CM | POA: Diagnosis not present

## 2018-08-30 LAB — BASIC METABOLIC PANEL
Anion gap: 9 (ref 5–15)
BUN: 14 mg/dL (ref 6–20)
CO2: 22 mmol/L (ref 22–32)
Calcium: 9 mg/dL (ref 8.9–10.3)
Chloride: 108 mmol/L (ref 98–111)
Creatinine, Ser: 0.84 mg/dL (ref 0.44–1.00)
GFR calc Af Amer: >60 mL/min (ref >60)
GFR calc non Af Amer: >60 mL/min (ref >60)
Glucose, Bld: 100 mg/dL — ABNORMAL HIGH (ref 70–99)
Potassium: 4 mmol/L (ref 3.5–5.1)
SODIUM: 139 mmol/L (ref 135–145)

## 2018-08-30 LAB — CBC WITH DIFFERENTIAL/PLATELET
Abs Immature Granulocytes: 0.03 10*3/uL (ref 0.00–0.07)
Basophils Absolute: 0 10*3/uL (ref 0.0–0.1)
Basophils Relative: 0 %
EOS PCT: 1 %
Eosinophils Absolute: 0.1 10*3/uL (ref 0.0–0.5)
HCT: 42.3 % (ref 36.0–46.0)
Hemoglobin: 13.7 g/dL (ref 12.0–15.0)
Immature Granulocytes: 0 %
Lymphocytes Relative: 33 %
Lymphs Abs: 2.9 10*3/uL (ref 0.7–4.0)
MCH: 28.8 pg (ref 26.0–34.0)
MCHC: 32.4 g/dL (ref 30.0–36.0)
MCV: 88.9 fL (ref 80.0–100.0)
MONO ABS: 0.5 10*3/uL (ref 0.1–1.0)
Monocytes Relative: 6 %
Neutro Abs: 5.4 10*3/uL (ref 1.7–7.7)
Neutrophils Relative %: 60 %
Platelets: 436 10*3/uL — ABNORMAL HIGH (ref 150–400)
RBC: 4.76 MIL/uL (ref 3.87–5.11)
RDW: 14.6 % (ref 11.5–15.5)
WBC: 9 10*3/uL (ref 4.0–10.5)
nRBC: 0 % (ref 0.0–0.2)

## 2018-08-30 LAB — INFLUENZA PANEL BY PCR (TYPE A & B)
Influenza A By PCR: NEGATIVE
Influenza B By PCR: NEGATIVE

## 2018-08-30 MED ORDER — BENZONATATE 100 MG PO CAPS
200.0000 mg | ORAL_CAPSULE | Freq: Once | ORAL | Status: AC
  Administered 2018-08-30: 200 mg via ORAL
  Filled 2018-08-30: qty 2

## 2018-08-30 MED ORDER — ALBUTEROL SULFATE HFA 108 (90 BASE) MCG/ACT IN AERS
2.0000 | INHALATION_SPRAY | Freq: Once | RESPIRATORY_TRACT | Status: AC
  Administered 2018-08-30: 2 via RESPIRATORY_TRACT
  Filled 2018-08-30: qty 6.7

## 2018-08-30 MED ORDER — BENZONATATE 100 MG PO CAPS: 200.0000 mg | ORAL_CAPSULE | Freq: Three times a day (TID) | ORAL | 0 refills | Status: DC | PRN

## 2018-08-30 NOTE — Discharge Instructions (Addendum)
As discussed, you are being screened for possible Covid 19 infection which should result within the next 4-5 days.  You will be notified by the [facility] department with these results.  In the interim you need to self quarantine yourself in your home as discussed to avoid spreading this possible infection to others.  You may use the inhaler you were given today taking 2 puffs every 4 hours if needed for cough or wheezing.  Additionally Tessalon will also help you with cough.  Get rechecked for any increasing weakness or shortness of breath by returning here.  As discussed it would be advisable to let our department know ahead of time that you are currently under surveillance for this possible infection.  Refer to the information below.     Person Under Monitoring Name: ROANNA REAVES  Location: [address] [place] [address]   Infection Prevention Recommendations for Individuals Confirmed to have, or Being Evaluated for, 2019 Novel Coronavirus (COVID-19) Infection Who Receive Care at Home  Individuals who are confirmed to have, or are being evaluated for, COVID-19 should follow the prevention steps below until a healthcare provider or local or state health department says they can return to normal activities.  Stay home except to get medical care You should restrict activities outside your home, except for getting medical care. Do not go to work, school, or public areas, and do not use public transportation or taxis.  Call ahead before visiting your doctor Before your medical appointment, call the healthcare provider and tell them that you have, or are being evaluated for, COVID-19 infection. This will help the healthcare providers office take steps to keep other people from getting infected. Ask your healthcare provider to call the local or state health department.  Monitor your symptoms Seek prompt medical attention if your illness is worsening (e.g., difficulty breathing).  Before going to your medical appointment, call the healthcare provider and tell them that you have, or are being evaluated for, COVID-19 infection. Ask your healthcare provider to call the local or state health department.  Wear a facemask You should wear a facemask that covers your nose and mouth when you are in the same room with other people and when you visit a healthcare provider. People who live with or visit you should also wear a facemask while they are in the same room with you.  Separate yourself from other people in your home As much as possible, you should stay in a different room from other people in your home. Also, you should use a separate bathroom, if available.  Avoid sharing household items You should not share dishes, drinking glasses, cups, eating utensils, towels, bedding, or other items with other people in your home. After using these items, you should wash them thoroughly with soap and water.  Cover your coughs and sneezes Cover your mouth and nose with a tissue when you cough or sneeze, or you can cough or sneeze into your sleeve. Throw used tissues in a lined trash can, and immediately wash your hands with soap and water for at least 20 seconds or use an alcohol-based hand rub.  Wash your [facility] your hands often and thoroughly with soap and water for at least 20 seconds. You can use an alcohol-based hand sanitizer if soap and water are not available and if your hands are not visibly dirty. Avoid touching your eyes, nose, and mouth with unwashed hands.   Prevention Steps for Caregivers and Household Members of  Individuals Confirmed to have, or Being Evaluated for, COVID-19 Infection Being Cared for in the Home  If you live with, or provide care at home for, a person confirmed to have, or being evaluated for, COVID-19 infection please follow these guidelines to prevent infection:  Follow healthcare providers instructions Make sure that you understand  and can help the patient follow any healthcare provider instructions for all care.  Provide for the patients basic needs You should help the patient with basic needs in the home and provide support for getting groceries, prescriptions, and other personal needs.  Monitor the patients symptoms If they are getting sicker, call his or her medical provider and tell them that the patient has, or is being evaluated for, COVID-19 infection. This will help the healthcare providers office take steps to keep other people from getting infected. Ask the healthcare provider to call the local or state health department.  Limit the number of people who have contact with the patient If possible, have only one caregiver for the patient. Other household members should stay in another home or place of residence. If this is not possible, they should stay in another room, or be separated from the patient as much as possible. Use a separate bathroom, if available. Restrict visitors who do not have an essential need to be in the home.  Keep older adults, very young children, and other sick people away from the patient Keep older adults, very young children, and those who have compromised immune systems or chronic health conditions away from the patient. This includes people with chronic heart, lung, or kidney conditions, diabetes, and cancer.  Ensure good ventilation Make sure that shared spaces in the home have good air flow, such as from an air conditioner or an opened window, weather permitting.  Wash your hands often Wash your hands often and thoroughly with soap and water for at least 20 seconds. You can use an alcohol based hand sanitizer if soap and water are not available and if your hands are not visibly dirty. Avoid touching your eyes, nose, and mouth with unwashed hands. Use disposable paper towels to dry your hands. If not available, use dedicated cloth towels and replace them when they become  wet.  Wear a facemask and gloves Wear a disposable facemask at all times in the room and gloves when you touch or have contact with the patients blood, body fluids, and/or secretions or excretions, such as sweat, saliva, sputum, nasal mucus, vomit, urine, or feces.  Ensure the mask fits over your nose and mouth tightly, and do not touch it during use. Throw out disposable facemasks and gloves after using them. Do not reuse. Wash your hands immediately after removing your facemask and gloves. If your personal clothing becomes contaminated, carefully remove clothing and launder. Wash your hands after handling contaminated clothing. Place all used disposable facemasks, gloves, and other waste in a lined container before disposing them with other household waste. Remove gloves and wash your hands immediately after handling these items.  Do not share dishes, glasses, or other household items with the patient Avoid sharing household items. You should not share dishes, drinking glasses, cups, eating utensils, towels, bedding, or other items with a patient who is confirmed to have, or being evaluated for, COVID-19 infection. After the person uses these items, you should wash them thoroughly with soap and water.  Wash laundry thoroughly Immediately remove and wash clothes or bedding that have blood, body fluids, and/or secretions or excretions, such as  sweat, saliva, sputum, nasal mucus, vomit, urine, or feces, on them. Wear gloves when handling laundry from the patient. Read and follow directions on labels of laundry or clothing items and detergent. In general, wash and dry with the warmest temperatures recommended on the label.  Clean all areas the individual has used often Clean all touchable surfaces, such as counters, tabletops, doorknobs, bathroom fixtures, toilets, phones, keyboards, tablets, and bedside tables, every day. Also, clean any surfaces that may have blood, body fluids, and/or  secretions or excretions on them. Wear gloves when cleaning surfaces the patient has come in contact with. Use a diluted bleach solution (e.g., dilute bleach with 1 part bleach and 10 parts water) or a household disinfectant with a label that says EPA-registered for coronaviruses. To make a bleach solution at home, add 1 tablespoon of bleach to 1 quart (4 cups) of water. For a larger supply, add  cup of bleach to 1 gallon (16 cups) of water. Read labels of cleaning products and follow recommendations provided on product labels. Labels contain instructions for safe and effective use of the cleaning product including precautions you should take when applying the product, such as wearing gloves or eye protection and making sure you have good ventilation during use of the product. Remove gloves and wash hands immediately after cleaning.  Monitor yourself for signs and symptoms of illness Caregivers and household members are considered close contacts, should monitor their health, and will be asked to limit movement outside of the home to the extent possible. Follow the monitoring steps for close contacts listed on the symptom monitoring form.   ? If you have additional questions, contact your local health department or call the epidemiologist on call at [phone] (available 24/7). ? This guidance is subject to change. For the most up-to-date guidance from [employer], please refer to their website: YouBlogs.pl      Person Under Monitoring Name: ARIZBETH CAWTHORN  Location: [address] [place] [address]   CORONAVIRUS DISEASE 2019 (COVID-19) Guidance for Persons Under Investigation You are being tested for the virus that causes coronavirus disease 2019 (COVID-19). Public health actions are necessary to ensure protection of your health and the health of others, and to prevent further spread of infection. COVID-19 is caused by a virus that can  cause symptoms, such as fever, cough, and shortness of breath. The primary transmission from person to person is by coughing or sneezing. On July 08, 2018, the [facility] announced a [employer] Emergency of International Concern and on July 09, 2018 the U.S. Department of Health and Human Services declared a public health emergency. If the virus that causesCOVID-19 spreads in the community, it could have severe public health consequences.  As a person under investigation for COVID-19, the [place] advises you to adhere to the following guidance until your test results are reported to you. If your test result is positive, you will receive additional information from your provider and your local health department at that time.  Remain at home until you are cleared by your health provider or public health authorities.  Keep a log of visitors to your home using the form provided. Any visitors to your home must be aware of your isolation status. If you plan to move to a new address or leave the county, notify the local health department in your county. Call a doctor or seek care if you have an urgent medical need.  Before seeking medical care, call ahead and get instructions from the provider before arriving at the medical office, clinic or hospital. Notify them that you are being tested for the virus that causes COVID-19 so arrangements can be made, as necessary, to prevent transmission to others in the healthcare setting. Next, notify the local health department in your county. If a medical emergency arises and you need to call 911, inform the first responders that you are being tested for the virus that causes COVID-19. Next, notify the local health department in your county. Adhere to all guidance set forth by the [facility] for [facility] of patients that is based on guidance from  the Center for Disease Control and Prevention with suspected or confirmed COVID-19. It is provided with this guidance for Persons Under Investigation.  Your health and the health of our community are our top priorities. Public Health officials remain available to provide assistance and counseling to you about COVID-19 and compliance with this guidance.  Provider: ____________________________________________________________ Date: ______/_____/_________  By signing below, you acknowledge that you have read and agree to comply with this Guidance for Persons Under Investigation. ______________________________________________________________ Date: ______/_____/_________  WHO DO I CALL? You can find a list of local health departments here: [email or web address] Health Department: ____________________________________________________________________ Contact Name: ________________________________________________________________________ Telephone: ___________________________________________________________________________  Marice Potter, [place], Communicable Disease Branch COVID-19 Guidance for Persons Under Investigation August 14, 2018  Person Under Monitoring Name: ZAYLEY ARRAS  Location: [place] [address]   Record here the list of visitors to your home since you became ill with respiratory symptoms that led you to consult a health provider:  Visitor Name Date Time In Time Out Did this person come within 6 feet of you? Indicate Y or N Relationship to Person Under Monitoring Phone number Comments   ___/____/____ __:__ AM/PM __:__ AM/PM       ___/____/____ __:__ AM/PM __:__ AM/PM       ___/____/____ __:__ AM/PM __:__ AM/PM       ___/____/____ __:__ AM/PM __:__ AM/PM       ___/____/____ __:__ AM/PM __:__ AM/PM       ___/____/____ __:__ AM/PM __:__ AM/PM       ___/____/____ __:__ AM/PM __:__ AM/PM        ___/____/____ __:__ AM/PM __:__ AM/PM       ___/____/____ __:__ AM/PM __:__ AM/PM       ___/____/____ __:__ AM/PM __:__ AM/PM       ___/____/____ __:__ AM/PM __:__ AM/PM       ___/____/____ __:__ AM/PM __:__ AM/PM       ___/____/____ __:__ AM/PM __:__ AM/PM       ___/____/____ __:__ AM/PM __:__ AM/PM       Marice Potter, [place], Communicable Disease Branch

## 2018-08-30 NOTE — ED Triage Notes (Signed)
Patient complaining of fever, cough, body aches, and diarrhea x 4 days. States she was exposed to her cousin that tested positive for Covid 53 in [place].

## 2018-08-30 NOTE — ED Provider Notes (Signed)
[facility] EMERGENCY DEPARTMENT Provider Note   CSN: [id number] Arrival date & time: 08/30/18  1024    History   Chief Complaint Chief Complaint  Patient presents with  . Fever    HPI Crystal Perry is a 48 y.o. female with exposure to her cousin in [place] 8 days ago who was since tested positive for Covid 19, presenting with a 4 day history of flu like symptoms including fever to 100 along with generalized body aches, cough which has been productive of a yellow sputum.  She does report some mild shortness of breath and intermittent wheezing.  She also reports a few episodes of diarrhea, nonbloody. She denies abdominal pain, n/v.  She last took a dose of tylenol for her fever last night.  The history is provided by the patient.    Past Medical History:  Diagnosis Date  . Hives   . SVD (spontaneous vaginal delivery)    x 1    Patient Active Problem List   Diagnosis Date Noted  . Fibroids 03/03/2012  . OBESITY 08/06/2007  . LEUKOCYTOSIS 08/06/2007  . HIATAL HERNIA 08/06/2007  . LYMPHADENOPATHY, DIFFUSE 08/06/2007    Past Surgical History:  Procedure Laterality Date  . ABDOMINAL HYSTERECTOMY    . cyst from breast     right breast  . CYSTOSCOPY  03/26/2012   Procedure: CYSTOSCOPY;  Surgeon: [name], MD;  Location: [facility];  Service: Gynecology;  Laterality: N/A;  . DILATION AND CURETTAGE OF UTERUS    . ROBOTIC ASSISTED SUPRACERVICAL HYSTERECTOMY  03/26/12  . TUBAL LIGATION    . tubes tied    . WISDOM TOOTH EXTRACTION       OB History    Gravida  2   Para  1   Term  1   Preterm      AB  1   Living  1     SAB  1   TAB      Ectopic      Multiple      Live Births  1            Home Medications    Prior to Admission medications   Medication Sig Start Date End Date Taking? Authorizing Provider  benzonatate (TESSALON) 100 MG capsule Take 2 capsules (200 mg total) by mouth 3 (three) times daily as needed. 08/30/18   [name], PA-C   EPINEPHrine (EPIPEN 2-PAK) 0.3 mg/0.3 mL IJ SOAJ injection Inject 0.3 mLs (0.3 mg total) into the muscle once. 11/18/13   Orpah Greek, MD  loperamide (IMODIUM) 2 MG capsule Take 1 capsule (2 mg total) by mouth 4 (four) times daily as needed for diarrhea or loose stools. 05/19/18   Hayden Rasmussen, MD  metFORMIN (GLUCOPHAGE) 500 MG tablet Take 500 mg by mouth at bedtime.     [provider]  ondansetron (ZOFRAN) 4 MG tablet Take 1 tablet (4 mg total) by mouth every 8 (eight) hours as needed for nausea or vomiting. 05/19/18   Hayden Rasmussen, MD  predniSONE (DELTASONE) 20 MG tablet 3 tabs po daily x 3 days, then 2 tabs x 3 days, then 1.5 tabs x 3 days, then 1 tab x 3 days, then 0.5 tabs x 3 days 11/18/13   Orpah Greek, MD    Family History Family History  Problem Relation Age of Onset  . Diabetes Maternal Grandmother   . Prostate cancer Maternal Grandfather   . Stroke Mother   .  Diabetes Mother   . Hypertension Mother   . Asthma Daughter     Social History Social History   Tobacco Use  . Smoking status: Former Smoker    Last attempt to quit: 03/01/2018    Years since quitting: 0.4  . Smokeless tobacco: Never Used  Substance Use Topics  . Alcohol use: No  . Drug use: No     Allergies   Bactrim [sulfamethoxazole-trimethoprim]   Review of Systems Review of Systems  Constitutional: Positive for fever.  HENT: Negative for congestion and sore throat.   Eyes: Negative.   Respiratory: Positive for cough, shortness of breath and wheezing. Negative for chest tightness.   Cardiovascular: Negative for chest pain.  Gastrointestinal: Negative for abdominal pain, nausea and vomiting.  Genitourinary: Negative.   Musculoskeletal: Positive for myalgias. Negative for arthralgias, joint swelling and neck pain.  Skin: Negative.  Negative for rash and wound.  Neurological: Negative for dizziness, weakness, light-headedness, numbness and headaches.   Psychiatric/Behavioral: Negative.      Physical Exam Updated Vital Signs BP 118/88   Pulse 70   Temp 98.8 F (37.1 C) (Oral)   Resp 16   Ht 5\' 3"  (1.6 m)   Wt 87.1 kg   LMP 03/26/2012   SpO2 100%   BMI 34.01 kg/m   Physical Exam Vitals signs and nursing note reviewed.  Constitutional:      Appearance: She is well-developed.  HENT:     Head: Normocephalic and atraumatic.  Eyes:     Conjunctiva/sclera: Conjunctivae normal.  Neck:     Musculoskeletal: Normal range of motion.  Cardiovascular:     Rate and Rhythm: Normal rate and regular rhythm.     Heart sounds: Normal heart sounds.  Pulmonary:     Effort: Pulmonary effort is normal.     Breath sounds: Wheezing present.     Comments: Mild expiratory wheeze bilateral bases. Abdominal:     General: Bowel sounds are normal.     Palpations: Abdomen is soft.     Tenderness: There is no abdominal tenderness.  Musculoskeletal: Normal range of motion.  Skin:    General: Skin is warm and dry.  Neurological:     Mental Status: She is alert.      ED Treatments / Results  Labs (all labs ordered are listed, but only abnormal results are displayed) Labs Reviewed  CBC WITH DIFFERENTIAL/PLATELET - Abnormal; Notable for the following components:      Result Value   Platelets 436 (*)    All other components within normal limits  BASIC METABOLIC PANEL - Abnormal; Notable for the following components:   Glucose, Bld 100 (*)    All other components within normal limits  NOVEL CORONAVIRUS, NAA (HOSPITAL ORDER, SEND-OUT TO REF LAB)  INFLUENZA PANEL BY PCR (TYPE A & B)    EKG None  Radiology Dg Chest Port 1 View  Result Date: 08/30/2018 CLINICAL DATA:  Cough fever EXAM: PORTABLE CHEST 1 VIEW COMPARISON:  08/05/2013 FINDINGS: Cardiac shadows within normal limits. The lungs are well aerated bilaterally. No focal infiltrate or sizable effusion is seen. No bony abnormality noted. IMPRESSION: No acute abnormality seen.  Electronically Signed   By: Inez Catalina M.D.   On: 08/30/2018 14:41    Procedures Procedures (including critical care time)  Medications Ordered in ED Medications  albuterol (PROVENTIL HFA;VENTOLIN HFA) 108 (90 Base) MCG/ACT inhaler 2 puff (2 puffs Inhalation Given 08/30/18 1226)  benzonatate (TESSALON) capsule 200 mg (200 mg Oral Given 08/30/18  1226)     Initial Impression / Assessment and Plan / ED Course  I have reviewed the triage vital signs and the nursing notes.  Pertinent labs & imaging results that were available during my care of the patient were reviewed by me and considered in my medical decision making (see chart for details).        Pt with respiratory infection with positive exposure to Covid 19.  She is stable for dc home.  She was given an albuterol mdi and wheezing resolved after tx x2 puffs.  She was placed on home quarantine isolation pending Covid results.  She was given all information including visitor log, tips for isolation.  Discussed strict return precautions including sob, weakness, worsened sx.    Final Clinical Impressions(s) / ED Diagnoses   Final diagnoses:  Cough with fever  Exposure to Covid-19 Virus    ED Discharge Orders         Ordered    benzonatate (TESSALON) 100 MG capsule  3 times daily PRN     08/30/18 1526           [name], PA-C 08/30/18 1644    Maudie Flakes, MD 09/01/18 1121

## 2018-08-30 NOTE — ED Notes (Signed)
Patient came out of room to ask if she could use the public patient restroom. Explained to patient importance of staying inside of her room. Placed temporary curtains outside of room and encouraged patient use of private restroom in her own room.

## 2018-09-07 ENCOUNTER — Telehealth (HOSPITAL_COMMUNITY): Payer: Self-pay

## 2018-09-09 ENCOUNTER — Telehealth (HOSPITAL_COMMUNITY): Payer: Self-pay

## 2018-09-10 LAB — NOVEL CORONAVIRUS, NAA (HOSPITAL ORDER, SEND-OUT TO REF LAB): SARS-COV-2, NAA: NOT DETECTED

## 2018-09-10 LAB — NOVEL CORONAVIRUS, NAA (HOSP ORDER, SEND-OUT TO REF LAB; TAT 18-24 HRS)

## 2018-09-16 DIAGNOSIS — E669 Obesity, unspecified: Secondary | ICD-10-CM | POA: Diagnosis not present

## 2018-09-16 DIAGNOSIS — J189 Pneumonia, unspecified organism: Secondary | ICD-10-CM | POA: Diagnosis not present

## 2018-09-16 DIAGNOSIS — G4700 Insomnia, unspecified: Secondary | ICD-10-CM | POA: Diagnosis not present

## 2018-09-16 DIAGNOSIS — Z131 Encounter for screening for diabetes mellitus: Secondary | ICD-10-CM | POA: Diagnosis not present

## 2018-09-16 DIAGNOSIS — Z6832 Body mass index (BMI) 32.0-32.9, adult: Secondary | ICD-10-CM | POA: Diagnosis not present

## 2018-09-16 DIAGNOSIS — Z13220 Encounter for screening for lipoid disorders: Secondary | ICD-10-CM | POA: Diagnosis not present

## 2018-12-16 ENCOUNTER — Other Ambulatory Visit: Payer: Self-pay

## 2018-12-16 ENCOUNTER — Other Ambulatory Visit: Payer: Managed Care, Other (non HMO)

## 2018-12-16 DIAGNOSIS — Z20822 Contact with and (suspected) exposure to covid-19: Secondary | ICD-10-CM

## 2018-12-16 DIAGNOSIS — R6889 Other general symptoms and signs: Secondary | ICD-10-CM | POA: Diagnosis not present

## 2018-12-20 LAB — NOVEL CORONAVIRUS, NAA: SARS-CoV-2, NAA: NOT DETECTED

## 2018-12-22 ENCOUNTER — Other Ambulatory Visit: Payer: Self-pay

## 2018-12-22 ENCOUNTER — Emergency Department (HOSPITAL_COMMUNITY)
Admission: EM | Admit: 2018-12-22 | Discharge: 2018-12-22 | Disposition: A | Discharge status: Home / Self Care | Payer: BC Managed Care – PPO | Attending: Emergency Medicine | Admitting: Emergency Medicine

## 2018-12-22 ENCOUNTER — Encounter (HOSPITAL_COMMUNITY): Payer: Self-pay

## 2018-12-22 DIAGNOSIS — K529 Noninfective gastroenteritis and colitis, unspecified: Secondary | ICD-10-CM | POA: Insufficient documentation

## 2018-12-22 DIAGNOSIS — R112 Nausea with vomiting, unspecified: Secondary | ICD-10-CM | POA: Diagnosis not present

## 2018-12-22 DIAGNOSIS — Z87891 Personal history of nicotine dependence: Secondary | ICD-10-CM | POA: Insufficient documentation

## 2018-12-22 LAB — CBC WITH DIFFERENTIAL/PLATELET
Abs Immature Granulocytes: 0.02 10*3/uL (ref 0.00–0.07)
Basophils Absolute: 0 10*3/uL (ref 0.0–0.1)
Basophils Relative: 1 %
Eosinophils Absolute: 0.1 10*3/uL (ref 0.0–0.5)
Eosinophils Relative: 1 %
HCT: 41.6 % (ref 36.0–46.0)
Hemoglobin: 13.3 g/dL (ref 12.0–15.0)
Immature Granulocytes: 0 %
Lymphocytes Relative: 26 %
Lymphs Abs: 2.3 10*3/uL (ref 0.7–4.0)
MCH: 28.6 pg (ref 26.0–34.0)
MCHC: 32 g/dL (ref 30.0–36.0)
MCV: 89.5 fL (ref 80.0–100.0)
Monocytes Absolute: 0.6 10*3/uL (ref 0.1–1.0)
Monocytes Relative: 7 %
Neutro Abs: 5.8 10*3/uL (ref 1.7–7.7)
Neutrophils Relative %: 65 %
Platelets: 373 10*3/uL (ref 150–400)
RBC: 4.65 MIL/uL (ref 3.87–5.11)
RDW: 14.9 % (ref 11.5–15.5)
WBC: 8.9 10*3/uL (ref 4.0–10.5)
nRBC: 0 % (ref 0.0–0.2)

## 2018-12-22 LAB — URINALYSIS, ROUTINE W REFLEX MICROSCOPIC
Bilirubin Urine: NEGATIVE
Glucose, UA: NEGATIVE mg/dL
Hgb urine dipstick: NEGATIVE
Ketones, ur: NEGATIVE mg/dL
Leukocytes,Ua: NEGATIVE
Nitrite: NEGATIVE
Protein, ur: NEGATIVE mg/dL
Specific Gravity, Urine: 1.011 (ref 1.005–1.030)
pH: 6 (ref 5.0–8.0)

## 2018-12-22 LAB — COMPREHENSIVE METABOLIC PANEL
ALT: 23 U/L (ref 0–44)
AST: 20 U/L (ref 15–41)
Albumin: 3.7 g/dL (ref 3.5–5.0)
Alkaline Phosphatase: 59 U/L (ref 38–126)
Anion gap: 9 (ref 5–15)
BUN: 12 mg/dL (ref 6–20)
CO2: 24 mmol/L (ref 22–32)
Calcium: 9.1 mg/dL (ref 8.9–10.3)
Chloride: 105 mmol/L (ref 98–111)
Creatinine, Ser: 0.98 mg/dL (ref 0.44–1.00)
GFR calc Af Amer: >60 mL/min (ref >60)
GFR calc non Af Amer: >60 mL/min (ref >60)
Glucose, Bld: 98 mg/dL (ref 70–99)
Potassium: 4.3 mmol/L (ref 3.5–5.1)
Sodium: 138 mmol/L (ref 135–145)
Total Bilirubin: 0.7 mg/dL (ref 0.3–1.2)
Total Protein: 7.1 g/dL (ref 6.5–8.1)

## 2018-12-22 LAB — LIPASE, BLOOD: Lipase: 23 U/L (ref 11–51)

## 2018-12-22 MED ORDER — LOPERAMIDE HCL 2 MG PO CAPS: 2.0000 mg | ORAL_CAPSULE | Freq: Four times a day (QID) | ORAL | 0 refills | Status: DC | PRN

## 2018-12-22 MED ORDER — ONDANSETRON 4 MG PO TBDP: 4.0000 mg | ORAL_TABLET | Freq: Three times a day (TID) | ORAL | 0 refills | Status: DC | PRN

## 2018-12-22 MED ORDER — SODIUM CHLORIDE 0.9 % IV BOLUS
1000.0000 mL | Freq: Once | INTRAVENOUS | Status: AC
  Administered 2018-12-22: 1000 mL via INTRAVENOUS

## 2018-12-22 MED ORDER — LOPERAMIDE HCL 2 MG PO CAPS
4.0000 mg | ORAL_CAPSULE | Freq: Once | ORAL | Status: AC
  Administered 2018-12-22: 15:00:00 4 mg via ORAL
  Filled 2018-12-22: qty 2

## 2018-12-22 MED ORDER — ONDANSETRON HCL 4 MG/2ML IJ SOLN
4.0000 mg | Freq: Once | INTRAMUSCULAR | Status: AC
  Administered 2018-12-22: 4 mg via INTRAVENOUS
  Filled 2018-12-22: qty 2

## 2018-12-22 NOTE — ED Provider Notes (Signed)
[facility] EMERGENCY DEPARTMENT Provider Note   CSN: [id number] Arrival date & time: 12/22/18  1018    History   Chief Complaint Chief Complaint  Patient presents with  . Emesis  . Diarrhea    HPI Crystal Perry is a 48 y.o. female with no significant chronic medical conditions presenting with a 1 week history of nausea, vomiting and diarrhea.  She reports constant nausea with 3-4 episodes of emesis, worse if she attempts to eat along with 2 episodes of nonbloody liquid diarrhea daily.  She denies fevers or chills, denies cough, headache, abdominal pain, but endorses cramping intermittently improved with bm.  She reports generalized fatigue without dizziness, normal urination.  She has been able to keep down small swallows of fluids.  No known exposures to others with similar sx, no foreign travel, no recent abx use. Had a negative Covid test collected 5 days ago.        The history is provided by the patient.    Past Medical History:  Diagnosis Date  . Hives   . SVD (spontaneous vaginal delivery)    x 1    Patient Active Problem List   Diagnosis Date Noted  . Fibroids 03/03/2012  . OBESITY 08/06/2007  . LEUKOCYTOSIS 08/06/2007  . HIATAL HERNIA 08/06/2007  . LYMPHADENOPATHY, DIFFUSE 08/06/2007    Past Surgical History:  Procedure Laterality Date  . ABDOMINAL HYSTERECTOMY    . cyst from breast     right breast  . CYSTOSCOPY  03/26/2012   Procedure: CYSTOSCOPY;  Surgeon: [name], MD;  Location: [facility];  Service: Gynecology;  Laterality: N/A;  . DILATION AND CURETTAGE OF UTERUS    . ROBOTIC ASSISTED SUPRACERVICAL HYSTERECTOMY  03/26/12  . TUBAL LIGATION    . tubes tied    . WISDOM TOOTH EXTRACTION       OB History    Gravida  2   Para  1   Term  1   Preterm      AB  1   Living  1     SAB  1   TAB      Ectopic      Multiple      Live Births  1            Home Medications    Prior to Admission medications   Medication Sig Start  Date End Date Taking? Authorizing Provider  EPINEPHrine (EPIPEN 2-PAK) 0.3 mg/0.3 mL IJ SOAJ injection Inject 0.3 mLs (0.3 mg total) into the muscle once. 11/18/13  Yes Pollina, Gwenyth Allegra, MD  metFORMIN (GLUCOPHAGE) 500 MG tablet Take 500 mg by mouth at bedtime.    Yes [provider]  ondansetron (ZOFRAN) 4 MG tablet Take 1 tablet (4 mg total) by mouth every 8 (eight) hours as needed for nausea or vomiting. 05/19/18  Yes Hayden Rasmussen, MD  traZODone (DESYREL) 50 MG tablet Take 50 mg by mouth at bedtime. 09/16/18  Yes [provider]  Vitamin D, Ergocalciferol, (DRISDOL) 1.25 MG (50000 UT) CAPS capsule Take 50,000 Units by mouth once a week. 09/24/18  Yes [provider]  benzonatate (TESSALON) 100 MG capsule Take 2 capsules (200 mg total) by mouth 3 (three) times daily as needed. Patient not taking: Reported on 12/22/2018 08/30/18   [name], PA-C  loperamide (IMODIUM) 2 MG capsule Take 1 capsule (2 mg total) by mouth 4 (four) times daily as needed for diarrhea or loose stools. 12/22/18   [name],  PA-C  ondansetron (ZOFRAN ODT) 4 MG disintegrating tablet Take 1 tablet (4 mg total) by mouth every 8 (eight) hours as needed for nausea or vomiting. 12/22/18   Selden Noteboom, Almyra Free, PA-C  predniSONE (DELTASONE) 20 MG tablet 3 tabs po daily x 3 days, then 2 tabs x 3 days, then 1.5 tabs x 3 days, then 1 tab x 3 days, then 0.5 tabs x 3 days Patient not taking: Reported on 12/22/2018 11/18/13   Orpah Greek, MD    Family History Family History  Problem Relation Age of Onset  . Diabetes Maternal Grandmother   . Prostate cancer Maternal Grandfather   . Stroke Mother   . Diabetes Mother   . Hypertension Mother   . Asthma Daughter     Social History Social History   Tobacco Use  . Smoking status: Former Smoker    Quit date: 03/01/2018    Years since quitting: 0.8  . Smokeless tobacco: Never Used  Substance Use Topics  . Alcohol use: No  . Drug use: No      Allergies   Bactrim [sulfamethoxazole-trimethoprim]   Review of Systems Review of Systems  Constitutional: Positive for fatigue. Negative for chills and fever.  HENT: Negative for congestion and sore throat.   Eyes: Negative.   Respiratory: Negative for cough, chest tightness and shortness of breath.   Cardiovascular: Negative for chest pain.  Gastrointestinal: Positive for diarrhea, nausea and vomiting. Negative for abdominal pain.  Genitourinary: Negative.  Negative for dysuria and urgency.  Musculoskeletal: Negative for arthralgias, joint swelling and neck pain.  Skin: Negative.  Negative for rash and wound.  Neurological: Negative for dizziness, weakness, light-headedness, numbness and headaches.  Psychiatric/Behavioral: Negative.      Physical Exam Updated Vital Signs BP 127/90 (BP Location: Right Arm)   Pulse (!) 55   Temp 98.9 F (37.2 C) (Oral)   Resp 18   Ht 5\' 4"  (1.626 m)   Wt 84.4 kg   LMP 03/26/2012   SpO2 100%   BMI 31.93 kg/m   Physical Exam Vitals signs and nursing note reviewed.  Constitutional:      Appearance: She is well-developed.  HENT:     Head: Normocephalic and atraumatic.     Mouth/Throat:     Mouth: Mucous membranes are moist.  Eyes:     Conjunctiva/sclera: Conjunctivae normal.  Neck:     Musculoskeletal: Normal range of motion.  Cardiovascular:     Rate and Rhythm: Normal rate and regular rhythm.     Heart sounds: Normal heart sounds.  Pulmonary:     Effort: Pulmonary effort is normal.     Breath sounds: Normal breath sounds. No wheezing.  Abdominal:     General: Bowel sounds are normal. There is no distension.     Palpations: Abdomen is soft.     Tenderness: There is no abdominal tenderness. There is no guarding or rebound.  Musculoskeletal: Normal range of motion.  Skin:    General: Skin is warm and dry.  Neurological:     Mental Status: She is alert.      ED Treatments / Results  Labs (all labs ordered are listed, but  only abnormal results are displayed) Results for orders placed or performed during the hospital encounter of 12/22/18  Comprehensive metabolic panel  Result Value Ref Range   Sodium 138 135 - 145 mmol/L   Potassium 4.3 3.5 - 5.1 mmol/L   Chloride 105 98 - 111 mmol/L   CO2 24 22 -  32 mmol/L   Glucose, Bld 98 70 - 99 mg/dL   BUN 12 6 - 20 mg/dL   Creatinine, Ser 0.98 0.44 - 1.00 mg/dL   Calcium 9.1 8.9 - 10.3 mg/dL   Total Protein 7.1 6.5 - 8.1 g/dL   Albumin 3.7 3.5 - 5.0 g/dL   AST 20 15 - 41 U/L   ALT 23 0 - 44 U/L   Alkaline Phosphatase 59 38 - 126 U/L   Total Bilirubin 0.7 0.3 - 1.2 mg/dL   GFR calc non Af Amer >60 >60 mL/min   GFR calc Af Amer >60 >60 mL/min   Anion gap 9 5 - 15  Lipase, blood  Result Value Ref Range   Lipase 23 11 - 51 U/L  CBC with Differential  Result Value Ref Range   WBC 8.9 4.0 - 10.5 K/uL   RBC 4.65 3.87 - 5.11 MIL/uL   Hemoglobin 13.3 12.0 - 15.0 g/dL   HCT 41.6 36.0 - 46.0 %   MCV 89.5 80.0 - 100.0 fL   MCH 28.6 26.0 - 34.0 pg   MCHC 32.0 30.0 - 36.0 g/dL   RDW 14.9 11.5 - 15.5 %   Platelets 373 150 - 400 K/uL   nRBC 0.0 0.0 - 0.2 %   Neutrophils Relative % 65 %   Neutro Abs 5.8 1.7 - 7.7 K/uL   Lymphocytes Relative 26 %   Lymphs Abs 2.3 0.7 - 4.0 K/uL   Monocytes Relative 7 %   Monocytes Absolute 0.6 0.1 - 1.0 K/uL   Eosinophils Relative 1 %   Eosinophils Absolute 0.1 0.0 - 0.5 K/uL   Basophils Relative 1 %   Basophils Absolute 0.0 0.0 - 0.1 K/uL   Immature Granulocytes 0 %   Abs Immature Granulocytes 0.02 0.00 - 0.07 K/uL  Urinalysis, Routine w reflex microscopic  Result Value Ref Range   Color, Urine YELLOW YELLOW   APPearance CLEAR CLEAR   Specific Gravity, Urine 1.011 1.005 - 1.030   pH 6.0 5.0 - 8.0   Glucose, UA NEGATIVE NEGATIVE mg/dL   Hgb urine dipstick NEGATIVE NEGATIVE   Bilirubin Urine NEGATIVE NEGATIVE   Ketones, ur NEGATIVE NEGATIVE mg/dL   Protein, ur NEGATIVE NEGATIVE mg/dL   Nitrite NEGATIVE NEGATIVE    Leukocytes,Ua NEGATIVE NEGATIVE   No results found.   EKG None  Radiology No results found.  Procedures Procedures (including critical care time)  Medications Ordered in ED Medications  sodium chloride 0.9 % bolus 1,000 mL (0 mLs Intravenous Stopped 12/22/18 1235)  ondansetron (ZOFRAN) injection 4 mg (4 mg Intravenous Given 12/22/18 1135)  loperamide (IMODIUM) capsule 4 mg (4 mg Oral Given 12/22/18 1450)     Initial Impression / Assessment and Plan / ED Course  I have reviewed the triage vital signs and the nursing notes.  Pertinent labs & imaging results that were available during my care of the patient were reviewed by me and considered in my medical decision making (see chart for details).        Labs reviewed and discussed with pt.  She was given IV fluids, zofran and imodium after labs resulted.  She had no emesis or diarrhea while here and was able to tolerate PO challenge.  Suspect viral gastroenteritis.  abd exam prior to dc still benign, soft, no guarding.  Low suspicion for intraabdominal abscess/infection/appy/ obstruction.  Return precautions discussed.  The patient appears reasonably screened and/or stabilized for discharge and I doubt any other medical condition or other  EMC requiring further screening, evaluation, or treatment in the ED at this time prior to discharge.   Final Clinical Impressions(s) / ED Diagnoses   Final diagnoses:  Gastroenteritis    ED Discharge Orders         Ordered    loperamide (IMODIUM) 2 MG capsule  4 times daily PRN     12/22/18 1436    ondansetron (ZOFRAN ODT) 4 MG disintegrating tablet  Every 8 hours PRN     12/22/18 1436           Landis Martins 12/23/18 2329    Milton Ferguson, MD 12/25/18 1541

## 2018-12-22 NOTE — Discharge Instructions (Signed)
Rest and make sure you are drinking plenty of fluids.  Use the medications prescribed if your symptoms persist or return.  I recommend a bland diet for the next 24 hours, concentrate on simple carbohydrates like saltine crackers, bananas are also very good with helping to relieve your symptoms, rice, applesauce, make sure you are drinking plenty of fluids.  Get rechecked for any persistent or worsening symptoms.  Only take additional Imodium if you continue to have diarrhea.

## 2018-12-22 NOTE — ED Triage Notes (Signed)
Pt reports n/v/d x 1 week.  Reports had covid test last week and came back negative.  Denies other symptoms.

## 2019-01-21 DIAGNOSIS — N9489 Other specified conditions associated with female genital organs and menstrual cycle: Secondary | ICD-10-CM | POA: Diagnosis not present

## 2019-01-21 DIAGNOSIS — N898 Other specified noninflammatory disorders of vagina: Secondary | ICD-10-CM | POA: Diagnosis not present

## 2019-01-21 DIAGNOSIS — Z720 Tobacco use: Secondary | ICD-10-CM | POA: Diagnosis not present

## 2019-03-14 DIAGNOSIS — Z6833 Body mass index (BMI) 33.0-33.9, adult: Secondary | ICD-10-CM | POA: Diagnosis not present

## 2019-03-14 DIAGNOSIS — I10 Essential (primary) hypertension: Secondary | ICD-10-CM | POA: Diagnosis not present

## 2019-03-15 ENCOUNTER — Other Ambulatory Visit: Payer: Self-pay | Admitting: *Deleted

## 2019-03-15 DIAGNOSIS — Z20822 Contact with and (suspected) exposure to covid-19: Secondary | ICD-10-CM

## 2019-03-17 LAB — NOVEL CORONAVIRUS, NAA: SARS-CoV-2, NAA: NOT DETECTED

## 2019-04-30 DIAGNOSIS — J029 Acute pharyngitis, unspecified: Secondary | ICD-10-CM | POA: Diagnosis not present

## 2019-05-26 ENCOUNTER — Other Ambulatory Visit: Payer: Self-pay

## 2019-05-26 ENCOUNTER — Emergency Department (HOSPITAL_COMMUNITY)
Admission: EM | Admit: 2019-05-26 | Discharge: 2019-05-27 | Disposition: A | Discharge status: Home / Self Care | Payer: BC Managed Care – PPO | Attending: Emergency Medicine | Admitting: Emergency Medicine

## 2019-05-26 ENCOUNTER — Emergency Department (HOSPITAL_COMMUNITY): Payer: BC Managed Care – PPO

## 2019-05-26 ENCOUNTER — Encounter (HOSPITAL_COMMUNITY): Payer: Self-pay

## 2019-05-26 DIAGNOSIS — M25519 Pain in unspecified shoulder: Secondary | ICD-10-CM | POA: Diagnosis not present

## 2019-05-26 DIAGNOSIS — Z7984 Long term (current) use of oral hypoglycemic drugs: Secondary | ICD-10-CM | POA: Insufficient documentation

## 2019-05-26 DIAGNOSIS — M791 Myalgia, unspecified site: Secondary | ICD-10-CM | POA: Diagnosis not present

## 2019-05-26 DIAGNOSIS — M25512 Pain in left shoulder: Secondary | ICD-10-CM | POA: Diagnosis not present

## 2019-05-26 DIAGNOSIS — Z87891 Personal history of nicotine dependence: Secondary | ICD-10-CM | POA: Insufficient documentation

## 2019-05-26 DIAGNOSIS — M545 Low back pain: Secondary | ICD-10-CM | POA: Diagnosis not present

## 2019-05-26 DIAGNOSIS — S4992XA Unspecified injury of left shoulder and upper arm, initial encounter: Secondary | ICD-10-CM | POA: Diagnosis not present

## 2019-05-26 DIAGNOSIS — M25511 Pain in right shoulder: Secondary | ICD-10-CM | POA: Diagnosis not present

## 2019-05-26 DIAGNOSIS — M25561 Pain in right knee: Secondary | ICD-10-CM | POA: Insufficient documentation

## 2019-05-26 DIAGNOSIS — Z79899 Other long term (current) drug therapy: Secondary | ICD-10-CM | POA: Insufficient documentation
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
## 2019-05-27 MED ORDER — MELOXICAM 7.5 MG PO TABS: 7.5000 mg | ORAL_TABLET | Freq: Every day | ORAL | 0 refills | Status: AC

## 2019-05-27 MED ORDER — ACETAMINOPHEN 325 MG PO TABS
650.0000 mg | ORAL_TABLET | Freq: Once | ORAL | Status: AC
  Administered 2019-05-27: 650 mg via ORAL
  Filled 2019-05-27: qty 2

## 2019-05-27 MED ORDER — CYCLOBENZAPRINE HCL 10 MG PO TABS
10.0000 mg | ORAL_TABLET | Freq: Once | ORAL | Status: AC
  Administered 2019-05-27: 10 mg via ORAL
  Filled 2019-05-27: qty 1

## 2019-05-27 MED ORDER — METHOCARBAMOL 500 MG PO TABS: 500.0000 mg | ORAL_TABLET | Freq: Two times a day (BID) | ORAL | 0 refills | Status: DC

## 2019-05-27 MED ORDER — IBUPROFEN 800 MG PO TABS
800.0000 mg | ORAL_TABLET | Freq: Once | ORAL | Status: AC
  Administered 2019-05-27: 800 mg via ORAL
  Filled 2019-05-27: qty 1

## 2019-05-27 NOTE — Discharge Instructions (Signed)
Warm compresses to sore muscles for 30 minutes at a time. Take meloxicam and Robaxin as needed as prescribed for pain.  Do not drive or operate machinery if taking Robaxin. Follow-up with your primary care provider for recheck.

## 2019-05-30 ENCOUNTER — Encounter: Payer: Self-pay | Admitting: Family Medicine

## 2019-05-30 ENCOUNTER — Other Ambulatory Visit: Payer: Self-pay

## 2019-05-30 ENCOUNTER — Ambulatory Visit: Payer: BC Managed Care – PPO | Admitting: Family Medicine

## 2019-05-30 ENCOUNTER — Ambulatory Visit: Payer: Self-pay

## 2019-05-30 DIAGNOSIS — M542 Cervicalgia: Secondary | ICD-10-CM

## 2019-05-30 DIAGNOSIS — M5442 Lumbago with sciatica, left side: Secondary | ICD-10-CM | POA: Diagnosis not present

## 2019-05-30 MED ORDER — TRAMADOL HCL 50 MG PO TABS: 50.0000 mg | ORAL_TABLET | Freq: Four times a day (QID) | ORAL | 0 refills | Status: DC | PRN

## 2019-05-30 MED ORDER — DICLOFENAC SODIUM 75 MG PO TBEC: 75.0000 mg | DELAYED_RELEASE_TABLET | Freq: Two times a day (BID) | ORAL | 3 refills | Status: DC | PRN

## 2019-05-30 MED ORDER — BACLOFEN 10 MG PO TABS: 5.0000 mg | ORAL_TABLET | Freq: Three times a day (TID) | ORAL | 3 refills | Status: DC | PRN
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
## 2019-06-07 ENCOUNTER — Telehealth: Payer: Self-pay

## 2019-06-07 NOTE — Telephone Encounter (Signed)
Could you fax the order please, as patient does not have the Rx that Dr. Junius Roads gave her.

## 2019-06-07 NOTE — Telephone Encounter (Signed)
Beth with [facility] in [place] would like Rx to evaluate and treat patient for PT.  Stated that patient doesn't have the Rx.  Fax# [phone], CB# [phone].  Patient has an appointment Wednesday, 06/08/2019.  Please advise.  Thank you.

## 2019-06-07 NOTE — Telephone Encounter (Signed)
RX sent to number given

## 2019-06-08 ENCOUNTER — Other Ambulatory Visit: Payer: Self-pay

## 2019-06-08 ENCOUNTER — Ambulatory Visit (HOSPITAL_COMMUNITY)
Admission: RE | Admit: 2019-06-08 | Discharge: 2019-06-08 | Disposition: A | Discharge status: Home / Self Care | Payer: BC Managed Care – PPO | Source: Ambulatory Visit | Attending: Family Medicine | Admitting: Family Medicine

## 2019-06-08 ENCOUNTER — Telehealth: Payer: Self-pay | Admitting: Family Medicine

## 2019-06-08 ENCOUNTER — Telehealth: Payer: Self-pay | Admitting: Radiology

## 2019-06-08 DIAGNOSIS — R519 Headache, unspecified: Secondary | ICD-10-CM | POA: Diagnosis not present

## 2019-06-08 DIAGNOSIS — S0990XA Unspecified injury of head, initial encounter: Secondary | ICD-10-CM | POA: Diagnosis not present

## 2019-06-08 NOTE — Telephone Encounter (Signed)
Brain MRI looks normal.

## 2019-06-08 NOTE — Telephone Encounter (Signed)
Please advise 

## 2019-06-08 NOTE — Telephone Encounter (Signed)
Patient states she's still having headache ( with nausea, vomiting & dizziness) & left foot have swollen up about 2 days ago. Pt states she's supposed to start PT today @ 10:00am and just wasn't sure if she needed to go or what?

## 2019-06-08 NOTE — Telephone Encounter (Signed)
Still having severe headaches with nausea and vomiting, dizziness.  Will order stat MRI of brain.

## 2019-06-08 NOTE — Telephone Encounter (Signed)
FYI  Wed 06/08/19 3:00 PM Ap-Mri AP-MR 1 Mr Brain Wo Contrast Scheduled

## 2019-06-08 NOTE — Telephone Encounter (Signed)
Her MRI results are available now.

## 2019-06-08 NOTE — Telephone Encounter (Signed)
I called the patient to let her know of the plan today. The referral coordinator will work on this and then she or the faciIity will call the patient with information on the MRI. I also called [facility] in  to cancel her PT appointment today and to let them know we need to put PT on hold for now - we will be in touch when she is able to start.  (holding this message now as reminder to check back later)

## 2019-06-16 DIAGNOSIS — M2560 Stiffness of unspecified joint, not elsewhere classified: Secondary | ICD-10-CM | POA: Diagnosis not present

## 2019-06-16 DIAGNOSIS — M545 Low back pain: Secondary | ICD-10-CM | POA: Diagnosis not present

## 2019-06-16 DIAGNOSIS — M62838 Other muscle spasm: Secondary | ICD-10-CM | POA: Diagnosis not present

## 2019-06-16 DIAGNOSIS — M542 Cervicalgia: Secondary | ICD-10-CM | POA: Diagnosis not present

## 2019-06-22 ENCOUNTER — Other Ambulatory Visit: Payer: Self-pay

## 2019-06-22 ENCOUNTER — Ambulatory Visit (INDEPENDENT_AMBULATORY_CARE_PROVIDER_SITE_OTHER): Payer: BC Managed Care – PPO | Admitting: Family Medicine

## 2019-06-22 ENCOUNTER — Encounter: Payer: Self-pay | Admitting: Family Medicine

## 2019-06-22 DIAGNOSIS — M5442 Lumbago with sciatica, left side: Secondary | ICD-10-CM

## 2019-06-22 DIAGNOSIS — M542 Cervicalgia: Secondary | ICD-10-CM | POA: Diagnosis not present

## 2019-06-22 MED ORDER — HYDROCODONE-ACETAMINOPHEN 5-325 MG PO TABS: 1.0000 | ORAL_TABLET | Freq: Every evening | ORAL | 0 refills | Status: DC | PRN

## 2019-06-22 MED ORDER — TIZANIDINE HCL 2 MG PO TABS: 2.0000 mg | ORAL_TABLET | Freq: Four times a day (QID) | ORAL | 3 refills | Status: DC | PRN
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
## 2019-06-23 DIAGNOSIS — M62838 Other muscle spasm: Secondary | ICD-10-CM | POA: Diagnosis not present

## 2019-06-23 DIAGNOSIS — M542 Cervicalgia: Secondary | ICD-10-CM | POA: Diagnosis not present

## 2019-06-23 DIAGNOSIS — M2560 Stiffness of unspecified joint, not elsewhere classified: Secondary | ICD-10-CM | POA: Diagnosis not present

## 2019-06-23 DIAGNOSIS — M545 Low back pain: Secondary | ICD-10-CM | POA: Diagnosis not present

## 2019-06-30 DIAGNOSIS — M62838 Other muscle spasm: Secondary | ICD-10-CM | POA: Diagnosis not present

## 2019-06-30 DIAGNOSIS — M545 Low back pain: Secondary | ICD-10-CM | POA: Diagnosis not present

## 2019-06-30 DIAGNOSIS — M2560 Stiffness of unspecified joint, not elsewhere classified: Secondary | ICD-10-CM | POA: Diagnosis not present

## 2019-06-30 DIAGNOSIS — M542 Cervicalgia: Secondary | ICD-10-CM | POA: Diagnosis not present

## 2019-07-12 DIAGNOSIS — M542 Cervicalgia: Secondary | ICD-10-CM | POA: Diagnosis not present

## 2019-07-12 DIAGNOSIS — M2560 Stiffness of unspecified joint, not elsewhere classified: Secondary | ICD-10-CM | POA: Diagnosis not present

## 2019-07-12 DIAGNOSIS — M62838 Other muscle spasm: Secondary | ICD-10-CM | POA: Diagnosis not present

## 2019-07-12 DIAGNOSIS — M545 Low back pain: Secondary | ICD-10-CM | POA: Diagnosis not present

## 2019-07-15 ENCOUNTER — Telehealth: Payer: Self-pay | Admitting: Family Medicine

## 2019-07-15 MED ORDER — HYDROCODONE-ACETAMINOPHEN 5-325 MG PO TABS: 1.0000 | ORAL_TABLET | Freq: Every evening | ORAL | 0 refills | Status: DC | PRN

## 2019-07-15 NOTE — Telephone Encounter (Signed)
Please advise. Her next appointment with you is on 08/03/19 for a 6 weeks follow up.

## 2019-07-15 NOTE — Telephone Encounter (Signed)
I called to advise the patient the hydrocodone has been refilled. Due to her increased arm pain/numbness, I scheduled an appointment for her to come in on Monday morning for a recheck with Dr. Junius Roads.

## 2019-07-15 NOTE — Telephone Encounter (Signed)
Sent!

## 2019-07-15 NOTE — Telephone Encounter (Signed)
Patient called requesting a refill of pain medication Hydrocodone.  Please call patient to advise. Stated arm continues to go numb and stiff.  [phone] Also requesting something to help her sleep to at night.

## 2019-07-18 ENCOUNTER — Ambulatory Visit: Payer: BC Managed Care – PPO | Admitting: Family Medicine

## 2019-07-18 ENCOUNTER — Other Ambulatory Visit: Payer: Self-pay

## 2019-07-18 ENCOUNTER — Encounter: Payer: Self-pay | Admitting: Family Medicine

## 2019-07-18 DIAGNOSIS — M542 Cervicalgia: Secondary | ICD-10-CM | POA: Diagnosis not present
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
## 2019-07-19 DIAGNOSIS — M542 Cervicalgia: Secondary | ICD-10-CM | POA: Diagnosis not present

## 2019-07-19 DIAGNOSIS — M545 Low back pain: Secondary | ICD-10-CM | POA: Diagnosis not present

## 2019-07-19 DIAGNOSIS — M62838 Other muscle spasm: Secondary | ICD-10-CM | POA: Diagnosis not present

## 2019-07-19 DIAGNOSIS — M2560 Stiffness of unspecified joint, not elsewhere classified: Secondary | ICD-10-CM | POA: Diagnosis not present

## 2019-07-22 ENCOUNTER — Other Ambulatory Visit: Payer: Self-pay | Admitting: Family Medicine

## 2019-07-22 ENCOUNTER — Telehealth: Payer: Self-pay | Admitting: Family Medicine

## 2019-07-22 DIAGNOSIS — M542 Cervicalgia: Secondary | ICD-10-CM

## 2019-07-22 NOTE — Telephone Encounter (Signed)
Is she supposed to get an injection?

## 2019-07-22 NOTE — Telephone Encounter (Signed)
Pt called in stating Dr. Junius Roads approved her for a Cortizone shot but she's not sure wether he would be doing the shot or if he was referring her elsewhere for it.   [phone]

## 2019-07-22 NOTE — Telephone Encounter (Signed)
For cervical spine injections, we first need to get an MRI.

## 2019-07-25 NOTE — Telephone Encounter (Signed)
I called the patient and advised her of the plan. She said no one from [facility] has called her yet. Since the order is in the epic system and [facility] can see it, I gave the patient their number so she could call them directly to set up an appointment.

## 2019-07-26 ENCOUNTER — Telehealth: Payer: Self-pay | Admitting: Family Medicine

## 2019-07-26 DIAGNOSIS — M542 Cervicalgia: Secondary | ICD-10-CM | POA: Diagnosis not present

## 2019-07-26 DIAGNOSIS — M62838 Other muscle spasm: Secondary | ICD-10-CM | POA: Diagnosis not present

## 2019-07-26 DIAGNOSIS — M545 Low back pain: Secondary | ICD-10-CM | POA: Diagnosis not present

## 2019-07-26 DIAGNOSIS — M2560 Stiffness of unspecified joint, not elsewhere classified: Secondary | ICD-10-CM | POA: Diagnosis not present

## 2019-07-26 NOTE — Telephone Encounter (Signed)
Patient called advised she is not scheduled for her MRI until 08/08/2019. Patient said she has an appointment to see Dr Junius Roads 08/03/2019 but the MRI has been scheduled way out. Patient asked for a note extending her being out of work.  Patient asked if Dr Junius Roads want her to wait to have the MRI 08/08/2019? The number to contact patient is [phone]

## 2019-07-27 ENCOUNTER — Telehealth: Payer: Self-pay | Admitting: Family Medicine

## 2019-07-27 NOTE — Telephone Encounter (Signed)
See other note from today

## 2019-07-27 NOTE — Telephone Encounter (Signed)
Patient called asked for a call back letting her know when she can pick up her note. The number to contact patient is [phone]

## 2019-07-27 NOTE — Telephone Encounter (Signed)
Ok to extend note until after MRI.

## 2019-07-27 NOTE — Telephone Encounter (Signed)
Left message on the patient's voice mail to call back. We have already given a note to be out through 08/08/19. Does she want this extended through the end of that work week or just for a few days after the MRI, so as to give [place] time to get the results?

## 2019-07-27 NOTE — Telephone Encounter (Signed)
Note provided to remain out through 08/12/19. The patient will come to pick this up. Cancelled 08/03/19 appointment - will call her with the MRI results (MRI on 08/08/19).

## 2019-07-27 NOTE — Telephone Encounter (Signed)
Please advise 

## 2019-08-03 ENCOUNTER — Ambulatory Visit: Payer: BC Managed Care – PPO | Admitting: Family Medicine

## 2019-08-04 ENCOUNTER — Telehealth: Payer: Self-pay | Admitting: Family Medicine

## 2019-08-04 MED ORDER — HYDROCODONE-ACETAMINOPHEN 5-325 MG PO TABS: 1.0000 | ORAL_TABLET | Freq: Every evening | ORAL | 0 refills | Status: DC | PRN

## 2019-08-04 NOTE — Telephone Encounter (Signed)
Patient called requesting a refill of medication hydrocodone. Please send to pharmacy on file. Patient phone number is [phone].

## 2019-08-04 NOTE — Telephone Encounter (Signed)
I called and advised the patient. 

## 2019-08-04 NOTE — Telephone Encounter (Signed)
Please advise. Her MRI is on 08/08/19.

## 2019-08-04 NOTE — Telephone Encounter (Signed)
Sent!

## 2019-08-08 ENCOUNTER — Ambulatory Visit
Admission: RE | Admit: 2019-08-08 | Discharge: 2019-08-08 | Disposition: A | Discharge status: Home / Self Care | Payer: BC Managed Care – PPO | Source: Ambulatory Visit | Attending: Family Medicine | Admitting: Family Medicine

## 2019-08-08 ENCOUNTER — Other Ambulatory Visit: Payer: Self-pay

## 2019-08-08 ENCOUNTER — Telehealth: Payer: Self-pay | Admitting: Family Medicine

## 2019-08-08 ENCOUNTER — Encounter: Payer: Self-pay | Admitting: Radiology

## 2019-08-08 DIAGNOSIS — R200 Anesthesia of skin: Secondary | ICD-10-CM

## 2019-08-08 DIAGNOSIS — M542 Cervicalgia: Secondary | ICD-10-CM

## 2019-08-08 NOTE — Telephone Encounter (Signed)
Sent a message to the patient via mychart

## 2019-08-08 NOTE — Telephone Encounter (Signed)
MRI shows mild degenerative disc disease at three levels.  No disc protrusions and no nerve impingement.    I will order nerve conduction studies to try to determine what's causing the numbness sensations.

## 2019-08-10 ENCOUNTER — Encounter: Payer: Self-pay | Admitting: Family Medicine

## 2019-08-10 ENCOUNTER — Ambulatory Visit (INDEPENDENT_AMBULATORY_CARE_PROVIDER_SITE_OTHER): Payer: BC Managed Care – PPO | Admitting: Family Medicine

## 2019-08-10 ENCOUNTER — Other Ambulatory Visit: Payer: Self-pay

## 2019-08-10 DIAGNOSIS — M542 Cervicalgia: Secondary | ICD-10-CM

## 2019-08-10 DIAGNOSIS — R200 Anesthesia of skin: Secondary | ICD-10-CM

## 2019-08-10 MED ORDER — GABAPENTIN 300 MG PO CAPS: ORAL_CAPSULE | ORAL | 3 refills | Status: DC
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
## 2019-08-12 DIAGNOSIS — M545 Low back pain: Secondary | ICD-10-CM | POA: Diagnosis not present

## 2019-08-12 DIAGNOSIS — M542 Cervicalgia: Secondary | ICD-10-CM | POA: Diagnosis not present

## 2019-08-12 DIAGNOSIS — M62838 Other muscle spasm: Secondary | ICD-10-CM | POA: Diagnosis not present

## 2019-08-12 DIAGNOSIS — M2560 Stiffness of unspecified joint, not elsewhere classified: Secondary | ICD-10-CM | POA: Diagnosis not present

## 2019-08-15 ENCOUNTER — Telehealth: Payer: Self-pay | Admitting: Family Medicine

## 2019-08-15 NOTE — Telephone Encounter (Signed)
I called the patient. We received the paperwork from [employer] today, with the noon drop off of mail. I advised the patient these will be signed today and we will send them back to [employer]  tomorrow. I gave the patient the phone number to [facility], as it looks like they have tried to contact her regarding an appointment - she can call them back to schedule an appointment. The patient voiced understanding and said she will call that office.

## 2019-08-15 NOTE — Telephone Encounter (Signed)
I called the patient. See other message on this same subject from today.

## 2019-08-15 NOTE — Telephone Encounter (Signed)
Patient called.   Vern from [employer] told the patient that the hold on her disability paperwork was [employer]' signatures. The patient is requesting a call back on the status of that.   Call back: [phone]

## 2019-08-15 NOTE — Telephone Encounter (Signed)
Have you seen this form come through?

## 2019-08-15 NOTE — Telephone Encounter (Signed)
Patient called.   She is following up paperwork that she left for our office. She also wanted to know why she hasn't gotten a call about her referral to a nerve specialist..   Call back: [phone]

## 2019-08-15 NOTE — Telephone Encounter (Signed)
It wasn't in Friday's bag, so, it should be in today's bag which will here around 12.

## 2019-08-18 ENCOUNTER — Encounter: Payer: Self-pay | Admitting: Neurology

## 2019-08-19 ENCOUNTER — Other Ambulatory Visit: Payer: Self-pay

## 2019-08-19 ENCOUNTER — Telehealth: Payer: Self-pay | Admitting: Family Medicine

## 2019-08-19 DIAGNOSIS — R200 Anesthesia of skin: Secondary | ICD-10-CM

## 2019-08-19 DIAGNOSIS — M545 Low back pain: Secondary | ICD-10-CM | POA: Diagnosis not present

## 2019-08-19 DIAGNOSIS — M2560 Stiffness of unspecified joint, not elsewhere classified: Secondary | ICD-10-CM | POA: Diagnosis not present

## 2019-08-19 DIAGNOSIS — M542 Cervicalgia: Secondary | ICD-10-CM | POA: Diagnosis not present

## 2019-08-19 DIAGNOSIS — M62838 Other muscle spasm: Secondary | ICD-10-CM | POA: Diagnosis not present

## 2019-08-19 MED ORDER — TRAMADOL HCL 50 MG PO TABS: 50.0000 mg | ORAL_TABLET | Freq: Two times a day (BID) | ORAL | 0 refills | Status: DC | PRN

## 2019-08-19 NOTE — Telephone Encounter (Signed)
Please advise 

## 2019-08-19 NOTE — Telephone Encounter (Signed)
Patient called requesting a refill of Hydrocodone and wants [facility] to up the dosage.  Please call patient to advise.   [phone]

## 2019-08-19 NOTE — Telephone Encounter (Signed)
LMOM advising

## 2019-08-19 NOTE — Telephone Encounter (Signed)
I really don't want her on any pain meds at this point, but will call in some tramadol.

## 2019-08-24 ENCOUNTER — Telehealth: Payer: Self-pay | Admitting: Family Medicine

## 2019-08-24 ENCOUNTER — Encounter: Payer: Self-pay | Admitting: Family Medicine

## 2019-08-24 DIAGNOSIS — M2560 Stiffness of unspecified joint, not elsewhere classified: Secondary | ICD-10-CM | POA: Diagnosis not present

## 2019-08-24 DIAGNOSIS — M542 Cervicalgia: Secondary | ICD-10-CM | POA: Diagnosis not present

## 2019-08-24 DIAGNOSIS — M62838 Other muscle spasm: Secondary | ICD-10-CM | POA: Diagnosis not present

## 2019-08-24 DIAGNOSIS — M545 Low back pain: Secondary | ICD-10-CM | POA: Diagnosis not present

## 2019-08-24 NOTE — Telephone Encounter (Signed)
Patient called,   She and her physical therapist are requesting her PT be extended.  She is also requesting her out of work note be extended to include the time of the nerve conduction study she was referred to.   Call back: [phone]

## 2019-08-24 NOTE — Telephone Encounter (Signed)
Note printed.  Ok to extend PT.  Where is she going for PT?

## 2019-08-24 NOTE — Telephone Encounter (Signed)
Please advise. She will be seeing Dr. Posey Pronto for NCS on 09/14/19.

## 2019-08-25 ENCOUNTER — Telehealth: Payer: Self-pay | Admitting: Family Medicine

## 2019-08-25 NOTE — Telephone Encounter (Signed)
This is a follow up from the message from yesterday. You wanted to know where she was going for PT. OK to have her out until her follow up appointment with you on 09/21/19?

## 2019-08-25 NOTE — Telephone Encounter (Signed)
[place] for [employer] note.  PT Rx filled out.

## 2019-08-25 NOTE — Telephone Encounter (Signed)
Patient called back and stated that she was going to PT @  [facility]  [address], [place]  Patient stated has a note that was written and it has to have 09/21/19 on it.  Her daughter Aeriona Weisbecker will pick up.

## 2019-08-25 NOTE — Telephone Encounter (Signed)
I called and advised the patient her work note is ready for pickup. Faxing order for continued PT to [facility] in [place]. The patient mentioned her mother died this morning. Condolences given.

## 2019-08-25 NOTE — Telephone Encounter (Signed)
See message from 08/25/19 that continues on this subject.

## 2019-08-26 ENCOUNTER — Other Ambulatory Visit: Payer: BC Managed Care – PPO | Admitting: Adult Health

## 2019-09-06 NOTE — Telephone Encounter (Signed)
disregard

## 2019-09-07 DIAGNOSIS — M62838 Other muscle spasm: Secondary | ICD-10-CM | POA: Diagnosis not present

## 2019-09-07 DIAGNOSIS — M542 Cervicalgia: Secondary | ICD-10-CM | POA: Diagnosis not present

## 2019-09-07 DIAGNOSIS — M2560 Stiffness of unspecified joint, not elsewhere classified: Secondary | ICD-10-CM | POA: Diagnosis not present

## 2019-09-07 DIAGNOSIS — M545 Low back pain: Secondary | ICD-10-CM | POA: Diagnosis not present

## 2019-09-08 DIAGNOSIS — M2560 Stiffness of unspecified joint, not elsewhere classified: Secondary | ICD-10-CM | POA: Diagnosis not present

## 2019-09-08 DIAGNOSIS — M62838 Other muscle spasm: Secondary | ICD-10-CM | POA: Diagnosis not present

## 2019-09-08 DIAGNOSIS — M542 Cervicalgia: Secondary | ICD-10-CM | POA: Diagnosis not present

## 2019-09-08 DIAGNOSIS — M545 Low back pain: Secondary | ICD-10-CM | POA: Diagnosis not present

## 2019-09-14 ENCOUNTER — Encounter: Payer: BC Managed Care – PPO | Admitting: Neurology

## 2019-09-15 DIAGNOSIS — M62838 Other muscle spasm: Secondary | ICD-10-CM | POA: Diagnosis not present

## 2019-09-15 DIAGNOSIS — M2560 Stiffness of unspecified joint, not elsewhere classified: Secondary | ICD-10-CM | POA: Diagnosis not present

## 2019-09-15 DIAGNOSIS — M542 Cervicalgia: Secondary | ICD-10-CM | POA: Diagnosis not present

## 2019-09-15 DIAGNOSIS — M545 Low back pain: Secondary | ICD-10-CM | POA: Diagnosis not present

## 2019-09-21 ENCOUNTER — Other Ambulatory Visit: Payer: Self-pay

## 2019-09-21 ENCOUNTER — Encounter: Payer: Self-pay | Admitting: Family Medicine

## 2019-09-21 ENCOUNTER — Ambulatory Visit: Payer: BC Managed Care – PPO | Admitting: Family Medicine

## 2019-09-21 DIAGNOSIS — M5442 Lumbago with sciatica, left side: Secondary | ICD-10-CM

## 2019-09-21 DIAGNOSIS — M542 Cervicalgia: Secondary | ICD-10-CM | POA: Diagnosis not present

## 2019-09-21 DIAGNOSIS — R200 Anesthesia of skin: Secondary | ICD-10-CM | POA: Diagnosis not present
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
## 2019-10-03 ENCOUNTER — Telehealth: Payer: Self-pay | Admitting: Orthopedic Surgery

## 2019-10-03 NOTE — Telephone Encounter (Signed)
I spoke with the patient on this. I have checked with our procedure scheduler about options (the patient asked if there is anybody in [place]). Awaiting her response.

## 2019-10-03 NOTE — Telephone Encounter (Signed)
Crystal Perry states that she needs to be referred to a different Neurologist. She was scheduled with one, but they cancelled her appointment because that doctor went out on maternity leave early.  Please call to discuss.

## 2019-10-05 ENCOUNTER — Encounter: Payer: BC Managed Care – PPO | Admitting: Neurology

## 2019-10-05 NOTE — Telephone Encounter (Signed)
I am checking to see if you have found an alternative facility for the patient to have nerve conduction studies.

## 2019-10-06 DIAGNOSIS — M62838 Other muscle spasm: Secondary | ICD-10-CM | POA: Diagnosis not present

## 2019-10-06 DIAGNOSIS — M545 Low back pain: Secondary | ICD-10-CM | POA: Diagnosis not present

## 2019-10-06 DIAGNOSIS — M542 Cervicalgia: Secondary | ICD-10-CM | POA: Diagnosis not present

## 2019-10-06 DIAGNOSIS — M2560 Stiffness of unspecified joint, not elsewhere classified: Secondary | ICD-10-CM | POA: Diagnosis not present

## 2019-10-07 DIAGNOSIS — M542 Cervicalgia: Secondary | ICD-10-CM | POA: Diagnosis not present

## 2019-10-07 DIAGNOSIS — M62838 Other muscle spasm: Secondary | ICD-10-CM | POA: Diagnosis not present

## 2019-10-07 DIAGNOSIS — M2560 Stiffness of unspecified joint, not elsewhere classified: Secondary | ICD-10-CM | POA: Diagnosis not present

## 2019-10-07 DIAGNOSIS — M545 Low back pain: Secondary | ICD-10-CM | POA: Diagnosis not present

## 2019-10-07 NOTE — Telephone Encounter (Signed)
I called over to [facility] and left message with Dr. Brien Few assistant to return my call to see if can get pt in soon. Pending call back.

## 2019-10-11 NOTE — Telephone Encounter (Signed)
I called the patient and gave her an update. Any word yet from Dr. Lauris Poag office?

## 2019-10-17 ENCOUNTER — Telehealth: Payer: Self-pay | Admitting: Family Medicine

## 2019-10-17 ENCOUNTER — Encounter: Payer: Self-pay | Admitting: Family Medicine

## 2019-10-17 NOTE — Telephone Encounter (Signed)
Pt informed and stated understanding. I informed her it will be ready for her at the front desk.

## 2019-10-17 NOTE — Telephone Encounter (Signed)
Please advise 

## 2019-10-17 NOTE — Telephone Encounter (Signed)
Printed

## 2019-10-17 NOTE — Telephone Encounter (Signed)
Patient called requesting a updated Dr. Note for work. Patient states she need the date up until her Dr appointment with Dr. Junius Roads. 11/02/2019. Please call patient when updated note is ready. Patient phone number is [phone].

## 2019-10-18 NOTE — Telephone Encounter (Signed)
Dr. Brien Few office has the referral and is still in review

## 2019-10-18 NOTE — Telephone Encounter (Signed)
Noted  

## 2019-10-26 ENCOUNTER — Emergency Department (HOSPITAL_COMMUNITY): Payer: BC Managed Care – PPO

## 2019-10-26 ENCOUNTER — Emergency Department (HOSPITAL_COMMUNITY)
Admission: EM | Admit: 2019-10-26 | Discharge: 2019-10-26 | Disposition: A | Discharge status: Home / Self Care | Payer: BC Managed Care – PPO | Attending: Emergency Medicine | Admitting: Emergency Medicine

## 2019-10-26 ENCOUNTER — Encounter (HOSPITAL_COMMUNITY): Payer: Self-pay | Admitting: *Deleted

## 2019-10-26 ENCOUNTER — Other Ambulatory Visit: Payer: Self-pay

## 2019-10-26 ENCOUNTER — Encounter: Payer: Self-pay | Admitting: Radiology

## 2019-10-26 DIAGNOSIS — R1030 Lower abdominal pain, unspecified: Secondary | ICD-10-CM | POA: Diagnosis not present

## 2019-10-26 DIAGNOSIS — R350 Frequency of micturition: Secondary | ICD-10-CM

## 2019-10-26 DIAGNOSIS — M545 Low back pain, unspecified: Secondary | ICD-10-CM

## 2019-10-26 DIAGNOSIS — M549 Dorsalgia, unspecified: Secondary | ICD-10-CM

## 2019-10-26 DIAGNOSIS — R109 Unspecified abdominal pain: Secondary | ICD-10-CM | POA: Diagnosis not present

## 2019-10-26 LAB — COMPREHENSIVE METABOLIC PANEL
ALT: 18 U/L (ref 0–44)
AST: 14 U/L — ABNORMAL LOW (ref 15–41)
Albumin: 3.7 g/dL (ref 3.5–5.0)
Alkaline Phosphatase: 60 U/L (ref 38–126)
Anion gap: 12 (ref 5–15)
BUN: 17 mg/dL (ref 6–20)
CO2: 24 mmol/L (ref 22–32)
Calcium: 9.7 mg/dL (ref 8.9–10.3)
Chloride: 105 mmol/L (ref 98–111)
Creatinine, Ser: 1.03 mg/dL — ABNORMAL HIGH (ref 0.44–1.00)
GFR calc Af Amer: >60 mL/min (ref >60)
GFR calc non Af Amer: >60 mL/min (ref >60)
Glucose, Bld: 122 mg/dL — ABNORMAL HIGH (ref 70–99)
Potassium: 4.5 mmol/L (ref 3.5–5.1)
Sodium: 141 mmol/L (ref 135–145)
Total Bilirubin: 0.5 mg/dL (ref 0.3–1.2)
Total Protein: 6.9 g/dL (ref 6.5–8.1)

## 2019-10-26 LAB — CBC WITH DIFFERENTIAL/PLATELET
Abs Immature Granulocytes: 0.03 10*3/uL (ref 0.00–0.07)
Basophils Absolute: 0.1 10*3/uL (ref 0.0–0.1)
Basophils Relative: 1 %
Eosinophils Absolute: 0.1 10*3/uL (ref 0.0–0.5)
Eosinophils Relative: 1 %
HCT: 40.5 % (ref 36.0–46.0)
Hemoglobin: 13 g/dL (ref 12.0–15.0)
Immature Granulocytes: 0 %
Lymphocytes Relative: 26 %
Lymphs Abs: 2.7 10*3/uL (ref 0.7–4.0)
MCH: 29 pg (ref 26.0–34.0)
MCHC: 32.1 g/dL (ref 30.0–36.0)
MCV: 90.2 fL (ref 80.0–100.0)
Monocytes Absolute: 0.7 10*3/uL (ref 0.1–1.0)
Monocytes Relative: 7 %
Neutro Abs: 6.7 10*3/uL (ref 1.7–7.7)
Neutrophils Relative %: 65 %
Platelets: 404 10*3/uL — ABNORMAL HIGH (ref 150–400)
RBC: 4.49 MIL/uL (ref 3.87–5.11)
RDW: 15 % (ref 11.5–15.5)
WBC: 10.2 10*3/uL (ref 4.0–10.5)
nRBC: 0 % (ref 0.0–0.2)

## 2019-10-26 LAB — URINALYSIS, ROUTINE W REFLEX MICROSCOPIC
Bilirubin Urine: NEGATIVE
Glucose, UA: NEGATIVE mg/dL
Hgb urine dipstick: NEGATIVE
Ketones, ur: NEGATIVE mg/dL
Leukocytes,Ua: NEGATIVE
Nitrite: NEGATIVE
Protein, ur: NEGATIVE mg/dL
Specific Gravity, Urine: 1.021 (ref 1.005–1.030)
pH: 5 (ref 5.0–8.0)

## 2019-10-26 MED ORDER — MORPHINE SULFATE (PF) 4 MG/ML IV SOLN
4.0000 mg | Freq: Once | INTRAVENOUS | Status: AC
  Administered 2019-10-26: 4 mg via INTRAVENOUS
  Filled 2019-10-26: qty 1

## 2019-10-26 MED ORDER — SODIUM CHLORIDE 0.9 % IV BOLUS
500.0000 mL | Freq: Once | INTRAVENOUS | Status: AC
  Administered 2019-10-26: 500 mL via INTRAVENOUS

## 2019-10-26 MED ORDER — PROMETHAZINE HCL 25 MG PO TABS: 25.0000 mg | ORAL_TABLET | Freq: Four times a day (QID) | ORAL | 0 refills | Status: DC | PRN

## 2019-10-26 MED ORDER — NAPROXEN 500 MG PO TABS: 500.0000 mg | ORAL_TABLET | Freq: Two times a day (BID) | ORAL | 0 refills | Status: DC

## 2019-10-26 MED ORDER — PROMETHAZINE HCL 25 MG/ML IJ SOLN
25.0000 mg | Freq: Once | INTRAMUSCULAR | Status: AC
  Administered 2019-10-26: 25 mg via INTRAVENOUS
  Filled 2019-10-26: qty 1

## 2019-10-26 MED ORDER — IOHEXOL 300 MG/ML  SOLN
100.0000 mL | Freq: Once | INTRAMUSCULAR | Status: AC | PRN
  Administered 2019-10-26: 100 mL via INTRAVENOUS

## 2019-10-26 MED ORDER — IBUPROFEN 800 MG PO TABS
800.0000 mg | ORAL_TABLET | Freq: Once | ORAL | Status: AC
  Administered 2019-10-26: 800 mg via ORAL
  Filled 2019-10-26: qty 1

## 2019-10-26 MED ORDER — METHOCARBAMOL 500 MG PO TABS: 500.0000 mg | ORAL_TABLET | Freq: Two times a day (BID) | ORAL | 0 refills | Status: DC

## 2019-10-26 MED ORDER — ONDANSETRON 8 MG PO TBDP
8.0000 mg | ORAL_TABLET | Freq: Once | ORAL | Status: AC
  Administered 2019-10-26: 8 mg via ORAL
  Filled 2019-10-26: qty 1
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
## 2019-10-26 NOTE — ED Triage Notes (Signed)
Pt c/o lower back pain that radiates around both sides to lower abdomen along with nausea x 2 days. Denies vomiting, diarrhea, fever. Pt has used Tylenol at home without relief. Pt reports frequent urination, but denies dysuria and hematuria.

## 2019-10-26 NOTE — Discharge Instructions (Addendum)
1. Medications: robaxin, naproxyn, phenergan for nausea, usual home medications 2. Treatment: rest, drink plenty of fluids, gentle stretching as discussed, alternate ice and heat 3. Follow Up: Please followup with your primary doctor in 3 days for discussion of your diagnoses and further evaluation after today's visit;  Return to the ER for worsening back pain or abdominal pain, difficulty walking, loss of bowel or bladder control or other concerning symptoms

## 2019-10-26 NOTE — ED Notes (Signed)
Pt. With CT. 

## 2019-10-27 ENCOUNTER — Encounter: Payer: Self-pay | Admitting: Radiology

## 2019-10-29 LAB — URINE CULTURE: Culture: 50000 — AB

## 2019-11-02 ENCOUNTER — Other Ambulatory Visit: Payer: Self-pay

## 2019-11-02 ENCOUNTER — Ambulatory Visit (INDEPENDENT_AMBULATORY_CARE_PROVIDER_SITE_OTHER): Payer: BC Managed Care – PPO | Admitting: Family Medicine

## 2019-11-02 ENCOUNTER — Encounter: Payer: Self-pay | Admitting: Family Medicine

## 2019-11-02 DIAGNOSIS — M542 Cervicalgia: Secondary | ICD-10-CM

## 2019-11-02 DIAGNOSIS — M5442 Lumbago with sciatica, left side: Secondary | ICD-10-CM
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
## 2019-11-09 DIAGNOSIS — M9901 Segmental and somatic dysfunction of cervical region: Secondary | ICD-10-CM | POA: Diagnosis not present

## 2019-11-09 DIAGNOSIS — G44329 Chronic post-traumatic headache, not intractable: Secondary | ICD-10-CM | POA: Diagnosis not present

## 2019-11-09 DIAGNOSIS — M545 Low back pain: Secondary | ICD-10-CM | POA: Diagnosis not present

## 2019-11-09 DIAGNOSIS — M9903 Segmental and somatic dysfunction of lumbar region: Secondary | ICD-10-CM | POA: Diagnosis not present

## 2019-11-15 ENCOUNTER — Telehealth: Payer: Self-pay | Admitting: Family Medicine

## 2019-11-15 MED ORDER — NITROFURANTOIN MONOHYD MACRO 100 MG PO CAPS: 100.0000 mg | ORAL_CAPSULE | Freq: Two times a day (BID) | ORAL | 0 refills | Status: DC

## 2019-11-15 NOTE — Telephone Encounter (Signed)
Will call in macrobid.  Repeat culture if symptoms persist.

## 2019-11-15 NOTE — Telephone Encounter (Signed)
The patient complains of left lower back pain and a bad odor to her urine. Per the patient, the doctor listed as her PCP is "no longer in business,"  and she has no PCP currently. She would like to establish here, if possible. She has had these symptoms for weeks now. Her urine was checked some time ago at [facility] and the culture did not grow out much. There was no odor at that time, and then she forgot to follow up about it. The patient is allergic to Bactrim/sulfa drugs. Please advise.

## 2019-11-15 NOTE — Telephone Encounter (Signed)
I lmom advised of Dr. Junius Roads message below.

## 2019-11-15 NOTE — Telephone Encounter (Signed)
Pt called requesting a rx for a UTI; pt would like a call back when sent in.   [phone]

## 2019-11-16 DIAGNOSIS — M545 Low back pain: Secondary | ICD-10-CM | POA: Diagnosis not present

## 2019-11-16 DIAGNOSIS — G44329 Chronic post-traumatic headache, not intractable: Secondary | ICD-10-CM | POA: Diagnosis not present

## 2019-11-16 DIAGNOSIS — M9901 Segmental and somatic dysfunction of cervical region: Secondary | ICD-10-CM | POA: Diagnosis not present

## 2019-11-16 DIAGNOSIS — M9903 Segmental and somatic dysfunction of lumbar region: Secondary | ICD-10-CM | POA: Diagnosis not present

## 2019-11-25 DIAGNOSIS — M545 Low back pain: Secondary | ICD-10-CM | POA: Diagnosis not present

## 2019-11-25 DIAGNOSIS — M9901 Segmental and somatic dysfunction of cervical region: Secondary | ICD-10-CM | POA: Diagnosis not present

## 2019-11-25 DIAGNOSIS — M9903 Segmental and somatic dysfunction of lumbar region: Secondary | ICD-10-CM | POA: Diagnosis not present

## 2019-11-25 DIAGNOSIS — G44329 Chronic post-traumatic headache, not intractable: Secondary | ICD-10-CM | POA: Diagnosis not present

## 2019-11-28 ENCOUNTER — Ambulatory Visit (HOSPITAL_COMMUNITY): Payer: BC Managed Care – PPO

## 2019-12-07 ENCOUNTER — Ambulatory Visit (HOSPITAL_COMMUNITY): Admission: RE | Admit: 2019-12-07 | Payer: BC Managed Care – PPO | Source: Ambulatory Visit

## 2019-12-09 ENCOUNTER — Telehealth: Payer: Self-pay | Admitting: Family Medicine

## 2019-12-09 ENCOUNTER — Encounter: Payer: Self-pay | Admitting: Family Medicine

## 2019-12-09 NOTE — Telephone Encounter (Signed)
The patient and Dr. Junius Roads also communicated through [facility]. Printed letter and placed at the front desk.

## 2019-12-09 NOTE — Telephone Encounter (Signed)
Pt called stating she needs a note writing her back into work; states she would be able to pick the note up on Tuesday 12/13/19.  [phone]

## 2020-01-10 ENCOUNTER — Encounter: Payer: Self-pay | Admitting: Family Medicine

## 2020-01-10 ENCOUNTER — Other Ambulatory Visit: Payer: Self-pay | Admitting: Family Medicine

## 2020-01-10 MED ORDER — TRAMADOL HCL 50 MG PO TABS: 50.0000 mg | ORAL_TABLET | Freq: Two times a day (BID) | ORAL | 0 refills | Status: DC | PRN

## 2020-01-27 ENCOUNTER — Ambulatory Visit (INDEPENDENT_AMBULATORY_CARE_PROVIDER_SITE_OTHER): Payer: BC Managed Care – PPO | Admitting: Family Medicine

## 2020-01-27 ENCOUNTER — Encounter: Payer: Self-pay | Admitting: Family Medicine

## 2020-01-27 ENCOUNTER — Other Ambulatory Visit: Payer: Self-pay

## 2020-01-27 DIAGNOSIS — G8929 Other chronic pain: Secondary | ICD-10-CM

## 2020-01-27 DIAGNOSIS — M542 Cervicalgia: Secondary | ICD-10-CM

## 2020-01-27 DIAGNOSIS — M5440 Lumbago with sciatica, unspecified side: Secondary | ICD-10-CM | POA: Diagnosis not present
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
## 2020-01-27 NOTE — Progress Notes (Signed)
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

## 2020-01-31 ENCOUNTER — Telehealth: Payer: Self-pay | Admitting: Family Medicine

## 2020-01-31 DIAGNOSIS — R200 Anesthesia of skin: Secondary | ICD-10-CM

## 2020-01-31 DIAGNOSIS — M5440 Lumbago with sciatica, unspecified side: Secondary | ICD-10-CM

## 2020-01-31 DIAGNOSIS — M542 Cervicalgia: Secondary | ICD-10-CM

## 2020-01-31 DIAGNOSIS — R519 Headache, unspecified: Secondary | ICD-10-CM

## 2020-01-31 DIAGNOSIS — G8929 Other chronic pain: Secondary | ICD-10-CM

## 2020-01-31 NOTE — Telephone Encounter (Signed)
Please advise 

## 2020-01-31 NOTE — Telephone Encounter (Signed)
Orders placed.

## 2020-01-31 NOTE — Telephone Encounter (Signed)
I called and advised the patient that Dr. Junius Roads did place an order for this referral.

## 2020-01-31 NOTE — Telephone Encounter (Signed)
Patient called. She would like a referral sent to neurologist Dr. [name]. The fax number is [phone]

## 2020-02-07 ENCOUNTER — Telehealth: Payer: Self-pay

## 2020-02-07 ENCOUNTER — Ambulatory Visit: Payer: BC Managed Care – PPO | Admitting: Family Medicine

## 2020-02-07 NOTE — Telephone Encounter (Signed)
Disability form received from [facility] and sent to [employer]

## 2020-02-08 ENCOUNTER — Telehealth: Payer: Self-pay

## 2020-02-08 ENCOUNTER — Encounter: Payer: Self-pay | Admitting: Family Medicine

## 2020-02-08 NOTE — Telephone Encounter (Signed)
I called and advised her the forms have been signed and sent back to [employer] today, per medical records. [employer] should be faxing them back to [facility] for her. A copy will be placed in the media tab of her chart.

## 2020-02-08 NOTE — Telephone Encounter (Signed)
The patient called to see if the recent [employer] [facility] is in our office or still at [facility]. The patient has a physical with Dr. Junius Roads tomorrow and would like to pick it up. I told her I would check on this and let her know.

## 2020-02-09 ENCOUNTER — Encounter: Payer: Self-pay | Admitting: Family Medicine

## 2020-02-09 ENCOUNTER — Other Ambulatory Visit: Payer: Self-pay

## 2020-02-09 ENCOUNTER — Ambulatory Visit (INDEPENDENT_AMBULATORY_CARE_PROVIDER_SITE_OTHER): Payer: BC Managed Care – PPO | Admitting: Family Medicine

## 2020-02-09 VITALS — BP 95/65 | HR 78 | Ht 63.5 in | Wt 199.0 lb

## 2020-02-09 DIAGNOSIS — E6609 Other obesity due to excess calories: Secondary | ICD-10-CM | POA: Diagnosis not present

## 2020-02-09 DIAGNOSIS — Z6834 Body mass index (BMI) 34.0-34.9, adult: Secondary | ICD-10-CM | POA: Diagnosis not present

## 2020-02-09 DIAGNOSIS — Z833 Family history of diabetes mellitus: Secondary | ICD-10-CM | POA: Diagnosis not present

## 2020-02-09 DIAGNOSIS — Z0000 Encounter for general adult medical examination without abnormal findings: Secondary | ICD-10-CM | POA: Diagnosis not present

## 2020-02-09 MED ORDER — PROMETHAZINE HCL 25 MG PO TABS: 25.0000 mg | ORAL_TABLET | Freq: Four times a day (QID) | ORAL | 3 refills | Status: DC | PRN

## 2020-02-09 MED ORDER — NAPROXEN 500 MG PO TABS: 500.0000 mg | ORAL_TABLET | Freq: Two times a day (BID) | ORAL | 6 refills | Status: DC | PRN

## 2020-02-09 MED ORDER — TRAMADOL HCL 50 MG PO TABS: 50.0000 mg | ORAL_TABLET | Freq: Two times a day (BID) | ORAL | 0 refills | Status: DC | PRN

## 2020-02-09 NOTE — Progress Notes (Signed)
Office Visit Note   Patient: Crystal Perry           Date of Birth: 06-17-1970           MRN: [id number] Visit Date: 02/09/2020 Requested by: [name], MD [place],  [place] [address] PCP: [name], MD  Subjective: Chief Complaint  Patient presents with  . Annual Exam  . Medication Refill    phenergan & naproxen    HPI: She is here for a wellness exam.  She has not had one in years.  Now that she is almost 75, she decided to come in to be checked.  She is not having any specific medical concerns.  She has a family history of diabetes, hypertension and stroke in her mother.  Her father died of alcoholic cirrhosis.  Patient has not had any issues with these things, although when I was reviewing her record she had an elevated hemoglobin A1c about 5 years ago.  Patient denies any polyuria, polydipsia, blurry vision.  She has a history of a lumpectomy years ago, it was benign.  She has not had a mammogram in years.  She has never had a colonoscopy.  She had a hysterectomy for uterine fibroids.  She has not been to a gynecologist since then.  She does not get eye exams but she sees the dentist on a regular basis.                ROS:   All other systems were reviewed and are negative.  Objective: Vital Signs: BP 95/65   Pulse 78   Ht 5' 3.5" (1.613 m)   Wt 199 lb (90.3 kg)   LMP 03/26/2012   BMI 34.70 kg/m   Physical Exam:  General:  Alert and oriented, in no acute distress. Pulm:  Breathing unlabored. Psy:  Normal mood, congruent affect. Skin: No suspicious lesions HEENT:  Peak/AT, PERRLA, EOM Full, no nystagmus.  Funduscopic examination within normal limits.  No conjunctival erythema.  Tympanic membranes are pearly gray with normal landmarks.  External ear canals are normal.  Nasal passages are clear.  Oropharynx is clear.  No significant lymphadenopathy.  No thyromegaly or nodules.  2+ carotid pulses without bruits. CV: Regular rate and rhythm without  murmurs, rubs, or gallops.  No peripheral edema.  2+ radial and posterior tibial pulses. Lungs: Clear to auscultation throughout with no wheezing or areas of consolidation. Abd: Bowel sounds are active, no hepatosplenomegaly or masses.  Soft and nontender.  No audible bruits.  No evidence of ascites. Extremities: 2+ upper and lower DTRs.    Imaging: No results found.  Assessment & Plan: 1.  Wellness examination -Labs today. -Referral for screening colonoscopy. -Screening mammogram. -Recommended dilated eye exam.  2.  History of hyperglycemia/elevated hemoglobin A1c with family history of diabetes -Recheck today.     Procedures: No procedures performed  No notes on file     PMFS History: Patient Active Problem List   Diagnosis Date Noted  . LGSIL on Pap smear of cervix 04/28/2015  . Fibroids 03/03/2012  . OBESITY 08/06/2007  . LEUKOCYTOSIS 08/06/2007  . HIATAL HERNIA 08/06/2007  . LYMPHADENOPATHY, DIFFUSE 08/06/2007   Past Medical History:  Diagnosis Date  . Hives   . SVD (spontaneous vaginal delivery)    x 1    Family History  Problem Relation Age of Onset  . Diabetes Maternal Grandmother   . Prostate cancer Maternal Grandfather   . Colon cancer Maternal Grandfather   .  Cancer Maternal Grandfather   . Alcohol abuse Father   . Cirrhosis Father   . Stroke Mother   . Diabetes Mother   . Hypertension Mother   . Hyperlipidemia Mother   . Asthma Daughter   . Cancer Maternal Aunt   . Breast cancer Maternal Aunt     Past Surgical History:  Procedure Laterality Date  . ABDOMINAL HYSTERECTOMY    . cyst from breast     right breast  . CYSTOSCOPY  03/26/2012   Procedure: CYSTOSCOPY;  Surgeon: [name], MD;  Location: [facility];  Service: Gynecology;  Laterality: N/A;  . DILATION AND CURETTAGE OF UTERUS    . ROBOTIC ASSISTED SUPRACERVICAL HYSTERECTOMY  03/26/12  . TUBAL LIGATION    . tubes tied    . WISDOM TOOTH EXTRACTION     Social History    Occupational History  . Not on file  Tobacco Use  . Smoking status: Former Smoker    Quit date: 03/01/2018    Years since quitting: 1.9  . Smokeless tobacco: Never Used  Vaping Use  . Vaping Use: Never used  Substance and Sexual Activity  . Alcohol use: No  . Drug use: No  . Sexual activity: Yes    Birth control/protection: Surgical    Comment: BTL / hyst

## 2020-02-10 ENCOUNTER — Telehealth: Payer: Self-pay | Admitting: Family Medicine

## 2020-02-10 LAB — COMPREHENSIVE METABOLIC PANEL
AG Ratio: 1.4 (calc) (ref 1.0–2.5)
ALT: 17 U/L (ref 6–29)
AST: 15 U/L (ref 10–35)
Albumin: 4.1 g/dL (ref 3.6–5.1)
Alkaline phosphatase (APISO): 84 U/L (ref 31–125)
BUN: 13 mg/dL (ref 7–25)
CO2: 24 mmol/L (ref 20–32)
Calcium: 9.8 mg/dL (ref 8.6–10.2)
Chloride: 106 mmol/L (ref 98–110)
Creat: 0.98 mg/dL (ref 0.50–1.10)
Globulin: 3 g/dL (calc) (ref 1.9–3.7)
Glucose, Bld: 121 mg/dL — ABNORMAL HIGH (ref 65–99)
Potassium: 4.7 mmol/L (ref 3.5–5.3)
Sodium: 141 mmol/L (ref 135–146)
Total Bilirubin: 0.3 mg/dL (ref 0.2–1.2)
Total Protein: 7.1 g/dL (ref 6.1–8.1)

## 2020-02-10 LAB — CBC WITH DIFFERENTIAL/PLATELET
Absolute Monocytes: 592 cells/uL (ref 200–950)
Basophils Absolute: 36 cells/uL (ref 0–200)
Basophils Relative: 0.4 %
Eosinophils Absolute: 118 cells/uL (ref 15–500)
Eosinophils Relative: 1.3 %
HCT: 40.7 % (ref 35.0–45.0)
Hemoglobin: 13.5 g/dL (ref 11.7–15.5)
Lymphs Abs: 1547 cells/uL (ref 850–3900)
MCH: 29 pg (ref 27.0–33.0)
MCHC: 33.2 g/dL (ref 32.0–36.0)
MCV: 87.3 fL (ref 80.0–100.0)
MPV: 11 fL (ref 7.5–12.5)
Monocytes Relative: 6.5 %
Neutro Abs: 6807 cells/uL (ref 1500–7800)
Neutrophils Relative %: 74.8 %
Platelets: 401 10*3/uL — ABNORMAL HIGH (ref 140–400)
RBC: 4.66 10*6/uL (ref 3.80–5.10)
RDW: 14.2 % (ref 11.0–15.0)
Total Lymphocyte: 17 %
WBC: 9.1 10*3/uL (ref 3.8–10.8)

## 2020-02-10 LAB — THYROID PANEL WITH TSH
Free Thyroxine Index: 2.1 (ref 1.4–3.8)
T3 Uptake: 28 % (ref 22–35)
T4, Total: 7.6 ug/dL (ref 5.1–11.9)
TSH: 2.78 mIU/L

## 2020-02-10 LAB — HIGH SENSITIVITY CRP: hs-CRP: >10 mg/L — ABNORMAL HIGH

## 2020-02-10 LAB — LIPID PANEL
Cholesterol: 148 mg/dL (ref <200)
HDL: 34 mg/dL — ABNORMAL LOW (ref >=50)
LDL Cholesterol (Calc): 89 mg/dL (calc)
Non-HDL Cholesterol (Calc): 114 mg/dL (calc) (ref <130)
Total CHOL/HDL Ratio: 4.4 (calc) (ref <5.0)
Triglycerides: 148 mg/dL (ref <150)

## 2020-02-10 LAB — VITAMIN D 25 HYDROXY (VIT D DEFICIENCY, FRACTURES): Vit D, 25-Hydroxy: 28 ng/mL — ABNORMAL LOW (ref 30–100)

## 2020-02-10 LAB — HEMOGLOBIN A1C
Hgb A1c MFr Bld: 6.6 % of total Hgb — ABNORMAL HIGH (ref <5.7)
Mean Plasma Glucose: 143 (calc)
eAG (mmol/L): 7.9 (calc)

## 2020-02-10 NOTE — Telephone Encounter (Signed)
Labs are notable for the following:  Blood glucose is elevated again at 121.  In addition, the hemoglobin A1c is elevated in diabetes range at 6.6.  It is very important to exercise regularly and to minimize dietary intake of breads, pastas, cereals, sugars and sweets.  We should recheck glucose and hemoglobin A1c in about 3 to 4 months.  C-reactive protein, inflammation marker, is elevated.  This is a nonspecific test, but generally will improve with lifestyle changes as mentioned above.  We should recheck this in 3 to 4 months as well.  Vitamin D is low at 28.  We want this to be 50-80.  I recommend taking vitamin D3 at 5000 IU daily.  This is available over-the-counter.  All else looks good.

## 2020-02-28 DIAGNOSIS — M545 Low back pain: Secondary | ICD-10-CM | POA: Diagnosis not present

## 2020-02-28 DIAGNOSIS — M5416 Radiculopathy, lumbar region: Secondary | ICD-10-CM | POA: Diagnosis not present

## 2020-02-28 DIAGNOSIS — E669 Obesity, unspecified: Secondary | ICD-10-CM | POA: Diagnosis not present

## 2020-02-28 DIAGNOSIS — G4733 Obstructive sleep apnea (adult) (pediatric): Secondary | ICD-10-CM | POA: Diagnosis not present

## 2020-03-01 ENCOUNTER — Other Ambulatory Visit: Payer: Self-pay

## 2020-03-01 ENCOUNTER — Encounter (HOSPITAL_COMMUNITY): Payer: Self-pay

## 2020-03-01 ENCOUNTER — Ambulatory Visit (HOSPITAL_COMMUNITY)
Admission: RE | Admit: 2020-03-01 | Discharge: 2020-03-01 | Disposition: A | Discharge status: Home / Self Care | Payer: BC Managed Care – PPO | Source: Ambulatory Visit | Attending: Family Medicine | Admitting: Family Medicine

## 2020-03-01 DIAGNOSIS — Z1231 Encounter for screening mammogram for malignant neoplasm of breast: Secondary | ICD-10-CM | POA: Diagnosis not present

## 2020-03-01 DIAGNOSIS — Z0000 Encounter for general adult medical examination without abnormal findings: Secondary | ICD-10-CM

## 2020-03-02 ENCOUNTER — Telehealth: Payer: Self-pay | Admitting: Family Medicine

## 2020-03-02 DIAGNOSIS — R928 Other abnormal and inconclusive findings on diagnostic imaging of breast: Secondary | ICD-10-CM

## 2020-03-02 NOTE — Telephone Encounter (Signed)
Mammogram is inconclusive.

## 2020-03-02 NOTE — Addendum Note (Signed)
Addended by: Hortencia Pilar on: 03/02/2020 04:47 PM   Modules accepted: Orders

## 2020-03-05 ENCOUNTER — Other Ambulatory Visit (HOSPITAL_COMMUNITY): Payer: Self-pay | Admitting: Family Medicine

## 2020-03-05 DIAGNOSIS — R928 Other abnormal and inconclusive findings on diagnostic imaging of breast: Secondary | ICD-10-CM

## 2020-03-07 ENCOUNTER — Encounter: Payer: Self-pay | Admitting: Gastroenterology

## 2020-03-13 ENCOUNTER — Inpatient Hospital Stay (HOSPITAL_COMMUNITY): Admission: RE | Admit: 2020-03-13 | Payer: BC Managed Care – PPO | Source: Ambulatory Visit

## 2020-03-13 ENCOUNTER — Ambulatory Visit (HOSPITAL_COMMUNITY): Payer: BC Managed Care – PPO

## 2020-03-30 DIAGNOSIS — M5416 Radiculopathy, lumbar region: Secondary | ICD-10-CM | POA: Diagnosis not present

## 2020-03-30 DIAGNOSIS — M5459 Other low back pain: Secondary | ICD-10-CM | POA: Diagnosis not present

## 2020-03-31 ENCOUNTER — Other Ambulatory Visit: Payer: Self-pay

## 2020-03-31 ENCOUNTER — Encounter (HOSPITAL_COMMUNITY): Payer: Self-pay

## 2020-03-31 ENCOUNTER — Emergency Department (HOSPITAL_COMMUNITY)
Admission: EM | Admit: 2020-03-31 | Discharge: 2020-03-31 | Disposition: A | Discharge status: Home / Self Care | Payer: BC Managed Care – PPO | Attending: Emergency Medicine | Admitting: Emergency Medicine

## 2020-03-31 DIAGNOSIS — Z87891 Personal history of nicotine dependence: Secondary | ICD-10-CM | POA: Diagnosis not present

## 2020-03-31 DIAGNOSIS — D72829 Elevated white blood cell count, unspecified: Secondary | ICD-10-CM | POA: Insufficient documentation

## 2020-03-31 DIAGNOSIS — I10 Essential (primary) hypertension: Secondary | ICD-10-CM | POA: Insufficient documentation

## 2020-03-31 DIAGNOSIS — R001 Bradycardia, unspecified: Secondary | ICD-10-CM | POA: Diagnosis not present

## 2020-03-31 DIAGNOSIS — R42 Dizziness and giddiness: Secondary | ICD-10-CM | POA: Diagnosis not present

## 2020-03-31 LAB — BASIC METABOLIC PANEL
Anion gap: 12 (ref 5–15)
BUN: 19 mg/dL (ref 6–20)
CO2: 24 mmol/L (ref 22–32)
Calcium: 10.5 mg/dL — ABNORMAL HIGH (ref 8.9–10.3)
Chloride: 106 mmol/L (ref 98–111)
Creatinine, Ser: 0.95 mg/dL (ref 0.44–1.00)
GFR, Estimated: >60 mL/min (ref >60)
Glucose, Bld: 122 mg/dL — ABNORMAL HIGH (ref 70–99)
Potassium: 4.7 mmol/L (ref 3.5–5.1)
Sodium: 142 mmol/L (ref 135–145)

## 2020-03-31 LAB — URINALYSIS, ROUTINE W REFLEX MICROSCOPIC
Bilirubin Urine: NEGATIVE
Glucose, UA: NEGATIVE mg/dL
Hgb urine dipstick: NEGATIVE
Ketones, ur: NEGATIVE mg/dL
Leukocytes,Ua: NEGATIVE
Nitrite: NEGATIVE
Protein, ur: NEGATIVE mg/dL
Specific Gravity, Urine: 1.023 (ref 1.005–1.030)
pH: 6 (ref 5.0–8.0)

## 2020-03-31 LAB — CBC WITH DIFFERENTIAL/PLATELET
Abs Immature Granulocytes: 0.11 10*3/uL — ABNORMAL HIGH (ref 0.00–0.07)
Basophils Absolute: 0 10*3/uL (ref 0.0–0.1)
Basophils Relative: 0 %
Eosinophils Absolute: 0 10*3/uL (ref 0.0–0.5)
Eosinophils Relative: 0 %
HCT: 41.2 % (ref 36.0–46.0)
Hemoglobin: 13.2 g/dL (ref 12.0–15.0)
Immature Granulocytes: 1 %
Lymphocytes Relative: 13 %
Lymphs Abs: 2.6 10*3/uL (ref 0.7–4.0)
MCH: 28.8 pg (ref 26.0–34.0)
MCHC: 32 g/dL (ref 30.0–36.0)
MCV: 89.8 fL (ref 80.0–100.0)
Monocytes Absolute: 1 10*3/uL (ref 0.1–1.0)
Monocytes Relative: 5 %
Neutro Abs: 15.8 10*3/uL — ABNORMAL HIGH (ref 1.7–7.7)
Neutrophils Relative %: 81 %
Platelets: 424 10*3/uL — ABNORMAL HIGH (ref 150–400)
RBC: 4.59 MIL/uL (ref 3.87–5.11)
RDW: 15.2 % (ref 11.5–15.5)
WBC: 19.5 10*3/uL — ABNORMAL HIGH (ref 4.0–10.5)
nRBC: 0 % (ref 0.0–0.2)

## 2020-03-31 MED ORDER — MECLIZINE HCL 25 MG PO TABS: 25.0000 mg | ORAL_TABLET | Freq: Three times a day (TID) | ORAL | 0 refills | Status: DC | PRN

## 2020-03-31 MED ORDER — MECLIZINE HCL 12.5 MG PO TABS
25.0000 mg | ORAL_TABLET | Freq: Once | ORAL | Status: AC
  Administered 2020-03-31: 25 mg via ORAL
  Filled 2020-03-31: qty 2

## 2020-03-31 NOTE — ED Triage Notes (Signed)
Had injection in her back yesterday   Got home and became dizzy   Called the physician who sent her here for eval

## 2020-03-31 NOTE — Discharge Instructions (Signed)
As discussed,  your exam is reassuring today and you are being treated for vertigo with meclizine - you may take 1 tablet every 8 hours if needed for symptom relief.  As discussed, you may benefit by seeing a cardiologist for your low heart rate - this does not seem to be the source of todays symptoms however.  Additionally, you do have an elevated white blood cell count (wbc) which can frequently happen with steroid use - this injection you received yesterday may be the trigger for this.  However, I do recommend you having this blood test rechecked when you see your MD next month.  In the interim, get rechecked sooner for any new or worsening symptoms.
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
## 2020-04-02 ENCOUNTER — Ambulatory Visit: Payer: BC Managed Care – PPO | Admitting: Cardiology

## 2020-04-03 ENCOUNTER — Ambulatory Visit (HOSPITAL_COMMUNITY)
Admission: RE | Admit: 2020-04-03 | Discharge: 2020-04-03 | Disposition: A | Discharge status: Home / Self Care | Payer: BC Managed Care – PPO | Source: Ambulatory Visit | Attending: Family Medicine | Admitting: Family Medicine

## 2020-04-03 ENCOUNTER — Other Ambulatory Visit: Payer: Self-pay

## 2020-04-03 DIAGNOSIS — R928 Other abnormal and inconclusive findings on diagnostic imaging of breast: Secondary | ICD-10-CM

## 2020-04-03 DIAGNOSIS — R921 Mammographic calcification found on diagnostic imaging of breast: Secondary | ICD-10-CM | POA: Diagnosis not present

## 2020-04-13 ENCOUNTER — Ambulatory Visit: Payer: BC Managed Care – PPO | Admitting: Cardiology

## 2020-04-21 DIAGNOSIS — M5416 Radiculopathy, lumbar region: Secondary | ICD-10-CM | POA: Diagnosis not present

## 2020-04-21 DIAGNOSIS — M545 Low back pain, unspecified: Secondary | ICD-10-CM | POA: Diagnosis not present

## 2020-04-25 ENCOUNTER — Encounter: Payer: Self-pay | Admitting: Family Medicine

## 2020-04-25 ENCOUNTER — Telehealth: Payer: Self-pay | Admitting: *Deleted

## 2020-04-25 ENCOUNTER — Ambulatory Visit (INDEPENDENT_AMBULATORY_CARE_PROVIDER_SITE_OTHER): Payer: BC Managed Care – PPO | Admitting: Family Medicine

## 2020-04-25 ENCOUNTER — Other Ambulatory Visit: Payer: Self-pay

## 2020-04-25 VITALS — BP 105/73 | HR 101 | Ht 63.0 in | Wt 200.4 lb

## 2020-04-25 DIAGNOSIS — R739 Hyperglycemia, unspecified: Secondary | ICD-10-CM | POA: Diagnosis not present

## 2020-04-25 DIAGNOSIS — G8929 Other chronic pain: Secondary | ICD-10-CM

## 2020-04-25 DIAGNOSIS — M5440 Lumbago with sciatica, unspecified side: Secondary | ICD-10-CM

## 2020-04-25 DIAGNOSIS — M542 Cervicalgia: Secondary | ICD-10-CM

## 2020-04-25 DIAGNOSIS — R7982 Elevated C-reactive protein (CRP): Secondary | ICD-10-CM
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
## 2020-04-25 NOTE — Telephone Encounter (Signed)
Patient no showed PV today- Called patient and left message to return call by 5 pm today- If no call by 5 pm, PV and procedure will be canceled - no call at 5 pm -  PV and Procedure both canceled- No Show letter mailed to patient  

## 2020-04-30 ENCOUNTER — Ambulatory Visit: Payer: BC Managed Care – PPO | Admitting: Family Medicine

## 2020-05-09 ENCOUNTER — Encounter: Payer: BC Managed Care – PPO | Admitting: Gastroenterology

## 2020-06-06 DIAGNOSIS — Z113 Encounter for screening for infections with a predominantly sexual mode of transmission: Secondary | ICD-10-CM | POA: Diagnosis not present

## 2020-06-06 DIAGNOSIS — Z114 Encounter for screening for human immunodeficiency virus [HIV]: Secondary | ICD-10-CM | POA: Diagnosis not present

## 2020-06-06 DIAGNOSIS — N760 Acute vaginitis: Secondary | ICD-10-CM | POA: Diagnosis not present

## 2020-06-12 ENCOUNTER — Ambulatory Visit: Payer: BC Managed Care – PPO

## 2020-06-19 ENCOUNTER — Other Ambulatory Visit: Payer: Self-pay

## 2020-06-19 ENCOUNTER — Ambulatory Visit (INDEPENDENT_AMBULATORY_CARE_PROVIDER_SITE_OTHER): Payer: BC Managed Care – PPO

## 2020-06-19 DIAGNOSIS — R739 Hyperglycemia, unspecified: Secondary | ICD-10-CM

## 2020-06-19 DIAGNOSIS — R7982 Elevated C-reactive protein (CRP): Secondary | ICD-10-CM

## 2020-06-19 NOTE — Progress Notes (Signed)
Fasting labs only: hsCRP, BMET & Hgb A1C per Dr. Junius Roads.

## 2020-06-20 ENCOUNTER — Telehealth: Payer: Self-pay | Admitting: Family Medicine

## 2020-06-20 ENCOUNTER — Encounter: Payer: Self-pay | Admitting: Family Medicine

## 2020-06-20 ENCOUNTER — Other Ambulatory Visit: Payer: Self-pay | Admitting: Family Medicine

## 2020-06-20 DIAGNOSIS — R7982 Elevated C-reactive protein (CRP): Secondary | ICD-10-CM

## 2020-06-20 MED ORDER — METFORMIN HCL 500 MG PO TABS: 500.0000 mg | ORAL_TABLET | Freq: Two times a day (BID) | ORAL | 6 refills | Status: DC

## 2020-06-20 NOTE — Telephone Encounter (Signed)
I called and advised the patient of her results. She would like to go ahead and start on metformin ([employer]). For 2 weeks now, she has given up sodas, breads and fried foods - she plans to continue this.   I will try to add the additional tests to the blood drawn yesterday (possibly not enough, especially for the ESR).

## 2020-06-20 NOTE — Addendum Note (Signed)
Addended by: Hortencia Pilar on: 06/20/2020 04:21 PM   Modules accepted: Orders

## 2020-06-20 NOTE — Telephone Encounter (Signed)
A1C is elevated again at 6.7.  I suggest starting medication while you work on lifestyle changes.    CRP is also still elevated.  I will order additional labs to investigate.

## 2020-06-20 NOTE — Telephone Encounter (Signed)
Patient called needing a call back concerning the results of her blood work that was done yesterday. The number to contact patient is [phone]
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
## 2020-06-22 ENCOUNTER — Encounter: Payer: Self-pay | Admitting: Cardiovascular Disease

## 2020-06-22 ENCOUNTER — Encounter: Payer: Self-pay | Admitting: Radiology

## 2020-06-22 ENCOUNTER — Other Ambulatory Visit: Payer: Self-pay

## 2020-06-22 ENCOUNTER — Ambulatory Visit (INDEPENDENT_AMBULATORY_CARE_PROVIDER_SITE_OTHER): Payer: BC Managed Care – PPO

## 2020-06-22 ENCOUNTER — Encounter: Payer: Self-pay | Admitting: Family Medicine

## 2020-06-22 ENCOUNTER — Ambulatory Visit (INDEPENDENT_AMBULATORY_CARE_PROVIDER_SITE_OTHER): Payer: BC Managed Care – PPO | Admitting: Cardiovascular Disease

## 2020-06-22 VITALS — BP 130/83 | HR 83 | Ht 63.0 in | Wt 202.2 lb

## 2020-06-22 DIAGNOSIS — I10 Essential (primary) hypertension: Secondary | ICD-10-CM

## 2020-06-22 DIAGNOSIS — R001 Bradycardia, unspecified: Secondary | ICD-10-CM

## 2020-06-22 DIAGNOSIS — R55 Syncope and collapse: Secondary | ICD-10-CM

## 2020-06-22 NOTE — Progress Notes (Signed)
Enrolled patient for a 3 day Zio XT monitor to be mailed to patients home. Patient was instructed they only need to wear for 48hrs

## 2020-06-22 NOTE — Patient Instructions (Addendum)
Medication Instructions:  *If you need a refill on your cardiac medications before your next appointment, please call your pharmacy*  Testing/Procedures: Your physician has recommended that you wear a 48 hour holter monitor. Holter monitors are medical devices that record the heart's electrical activity. Doctors most often use these monitors to diagnose arrhythmias. Arrhythmias are problems with the speed or rhythm of the heartbeat. The monitor is a small, portable device. You can wear one while you do your normal daily activities. This is usually used to diagnose what is causing palpitations/syncope (passing out).  Your physician has requested that you have en exercise stress myoview. For further information please visit [email or web address]. Please follow instruction sheet, as given.  Follow-Up: At [facility], you and your health needs are our priority.  As part of our continuing mission to provide you with exceptional heart care, we have created designated Provider Care Teams.  These Care Teams include your primary Cardiologist (physician) and Advanced Practice Providers (APPs -  Physician Assistants and Nurse Practitioners) who all work together to provide you with the care you need, when you need it.  We recommend signing up for the patient portal called "MyChart".  Sign up information is provided on this After Visit Summary.  MyChart is used to connect with patients for Virtual Visits (Telemedicine).  Patients are able to view lab/test results, encounter notes, upcoming appointments, etc.  Non-urgent messages can be sent to your provider as well.   To learn more about what you can do with MyChart, go to [email or web address].    Your next appointment:   Your physician recommends that you schedule a follow-up appointment in: 1 YEAR in [place] with Dr. [name]  The format for your next appointment:   In Person with Jenkins Rouge, MD

## 2020-06-22 NOTE — Addendum Note (Signed)
Addended by: Bobby Rumpf C on: 06/22/2020 11:10 AM   Modules accepted: Orders

## 2020-06-25 ENCOUNTER — Telehealth: Payer: Self-pay | Admitting: Family Medicine

## 2020-06-25 ENCOUNTER — Ambulatory Visit: Payer: BC Managed Care – PPO | Admitting: Family Medicine

## 2020-06-25 DIAGNOSIS — Z1211 Encounter for screening for malignant neoplasm of colon: Secondary | ICD-10-CM

## 2020-06-25 LAB — BASIC METABOLIC PANEL
BUN: 18 mg/dL (ref 7–25)
CO2: 25 mmol/L (ref 20–32)
Calcium: 9.8 mg/dL (ref 8.6–10.4)
Chloride: 107 mmol/L (ref 98–110)
Creat: 1 mg/dL (ref 0.50–1.05)
Glucose, Bld: 122 mg/dL — ABNORMAL HIGH (ref 65–99)
Potassium: 5 mmol/L (ref 3.5–5.3)
Sodium: 140 mmol/L (ref 135–146)

## 2020-06-25 LAB — HEMOGLOBIN A1C
Hgb A1c MFr Bld: 6.7 % of total Hgb — ABNORMAL HIGH (ref <5.7)
Mean Plasma Glucose: 146 mg/dL
eAG (mmol/L): 8.1 mmol/L

## 2020-06-25 LAB — ANTI-NUCLEAR AB-TITER (ANA TITER): ANA Titer 1: 1:40 {titer} — ABNORMAL HIGH

## 2020-06-25 LAB — ANA: Anti Nuclear Antibody (ANA): POSITIVE — AB

## 2020-06-25 LAB — CYCLIC CITRUL PEPTIDE ANTIBODY, IGG: Cyclic Citrullin Peptide Ab: <16 UNITS

## 2020-06-25 LAB — THYROID PEROXIDASE ANTIBODY: Thyroperoxidase Ab SerPl-aCnc: 1 IU/mL (ref <9)

## 2020-06-25 LAB — SEDIMENTATION RATE

## 2020-06-25 LAB — URIC ACID: Uric Acid, Serum: 5.3 mg/dL (ref 2.5–7.0)

## 2020-06-25 LAB — HIGH SENSITIVITY CRP: hs-CRP: >10 mg/L — ABNORMAL HIGH

## 2020-06-25 LAB — RHEUMATOID FACTOR: Rheumatoid fact SerPl-aCnc: <14 IU/mL (ref <14)

## 2020-06-25 NOTE — Telephone Encounter (Signed)
ANA is mildly positive with a titer of 1:40.  This indicates autoimmunity of some sort.  All else looks good.

## 2020-06-26 NOTE — Addendum Note (Signed)
Addended by: Hortencia Pilar on: 06/26/2020 09:51 AM   Modules accepted: Orders

## 2020-06-27 ENCOUNTER — Encounter: Payer: Self-pay | Admitting: Family Medicine

## 2020-06-27 ENCOUNTER — Encounter (HOSPITAL_COMMUNITY): Payer: Self-pay | Admitting: *Deleted

## 2020-06-27 ENCOUNTER — Telehealth (HOSPITAL_COMMUNITY): Payer: Self-pay | Admitting: *Deleted

## 2020-06-27 DIAGNOSIS — R55 Syncope and collapse: Secondary | ICD-10-CM

## 2020-06-27 DIAGNOSIS — R001 Bradycardia, unspecified: Secondary | ICD-10-CM | POA: Diagnosis not present

## 2020-06-27 NOTE — Telephone Encounter (Signed)
Patient given detailed instructions per Myocardial Perfusion Study Information Sheet for the test on 07/04/20 at 1045. Patient notified to arrive 15 minutes early and that it is imperative to arrive on time for appointment to keep from having the test rescheduled.  If you need to cancel or reschedule your appointment, please call the office within 24 hours of your appointment. . Patient verbalized understanding.Santel Mychart letter sent with instructions.

## 2020-06-29 NOTE — Telephone Encounter (Signed)
Referral got faxed today at [phone]

## 2020-07-02 ENCOUNTER — Other Ambulatory Visit (HOSPITAL_COMMUNITY): Payer: BC Managed Care – PPO

## 2020-07-02 ENCOUNTER — Encounter: Payer: Self-pay | Admitting: Family Medicine

## 2020-07-04 ENCOUNTER — Encounter (HOSPITAL_COMMUNITY): Payer: BC Managed Care – PPO

## 2020-07-05 ENCOUNTER — Encounter: Payer: Self-pay | Admitting: Family Medicine

## 2020-07-06 ENCOUNTER — Ambulatory Visit (INDEPENDENT_AMBULATORY_CARE_PROVIDER_SITE_OTHER): Payer: BC Managed Care – PPO | Admitting: Gastroenterology

## 2020-07-06 ENCOUNTER — Other Ambulatory Visit: Payer: Self-pay

## 2020-07-06 ENCOUNTER — Encounter: Payer: Self-pay | Admitting: Gastroenterology

## 2020-07-06 DIAGNOSIS — K5900 Constipation, unspecified: Secondary | ICD-10-CM | POA: Insufficient documentation

## 2020-07-06 DIAGNOSIS — Z1211 Encounter for screening for malignant neoplasm of colon: Secondary | ICD-10-CM | POA: Diagnosis not present

## 2020-07-06 NOTE — Patient Instructions (Signed)
I recommend starting Benefiber daily and see how this works for you! If not helpful, you can try the samples of Linzess. Start taking Linzess 1 capsule 30 minutes before breakfast daily. It is normal to have some looser stool starting out for the first few days, but this should improve. If it does not, please call us, as we will need to adjust the dosage.   We are arranging a colonoscopy in the near future!  Further recommendations to follow!  It was a pleasure to see you today. I want to create trusting relationships with patients to provide genuine, compassionate, and quality care. I value your feedback. If you receive a survey regarding your visit,  I greatly appreciate you taking time to fill this out.   Annitta Needs, PhD, ANP-BC [facility]
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
## 2020-07-09 ENCOUNTER — Other Ambulatory Visit (HOSPITAL_COMMUNITY)
Admission: RE | Admit: 2020-07-09 | Discharge: 2020-07-09 | Disposition: A | Discharge status: Home / Self Care | Payer: BC Managed Care – PPO | Source: Ambulatory Visit | Attending: Cardiovascular Disease | Admitting: Cardiovascular Disease

## 2020-07-09 ENCOUNTER — Encounter (HOSPITAL_COMMUNITY): Payer: Self-pay | Admitting: *Deleted

## 2020-07-09 ENCOUNTER — Telehealth: Payer: Self-pay | Admitting: *Deleted

## 2020-07-09 DIAGNOSIS — Z20822 Contact with and (suspected) exposure to covid-19: Secondary | ICD-10-CM | POA: Diagnosis not present

## 2020-07-09 DIAGNOSIS — Z01812 Encounter for preprocedural laboratory examination: Secondary | ICD-10-CM | POA: Insufficient documentation

## 2020-07-09 LAB — SARS CORONAVIRUS 2 (TAT 6-24 HRS): SARS Coronavirus 2: NEGATIVE

## 2020-07-09 NOTE — Telephone Encounter (Signed)
Called pt, no answer, no VM. Patient needs TCS with Dr. Abbey Chatters, ASA 2, hold phentermine x 2 weeks

## 2020-07-10 MED ORDER — PEG 3350-KCL-NA BICARB-NACL 420 G PO SOLR: ORAL | 0 refills | Status: DC

## 2020-07-10 NOTE — Addendum Note (Signed)
Addended by: Cheron Every on: 07/10/2020 10:19 AM   Modules accepted: Orders

## 2020-07-10 NOTE — Telephone Encounter (Signed)
Patient returned call. She has been scheduled for 3/11 (am appt). Patient aware will need covid test 2 days prior. Advised also endo will call 3 days prior with arrival time.

## 2020-07-11 ENCOUNTER — Other Ambulatory Visit: Payer: Self-pay

## 2020-07-11 ENCOUNTER — Ambulatory Visit (HOSPITAL_COMMUNITY): Payer: BC Managed Care – PPO | Attending: Cardiovascular Disease

## 2020-07-11 DIAGNOSIS — R001 Bradycardia, unspecified: Secondary | ICD-10-CM | POA: Diagnosis not present

## 2020-07-11 DIAGNOSIS — R55 Syncope and collapse: Secondary | ICD-10-CM | POA: Diagnosis not present

## 2020-07-11 LAB — MYOCARDIAL PERFUSION IMAGING
Estimated workload: 7 METS
Exercise duration (min): 5 min
Exercise duration (sec): 46 s
LV dias vol: 71 mL (ref 46–106)
LV sys vol: 29 mL
MPHR: 170 {beats}/min
Peak HR: 153 {beats}/min
Percent HR: 90 %
Rest HR: 42 {beats}/min
SDS: 1
SRS: 0
SSS: 1
TID: 0.87

## 2020-07-11 MED ORDER — TECHNETIUM TC 99M TETROFOSMIN IV KIT
10.9000 | PACK | Freq: Once | INTRAVENOUS | Status: AC | PRN
  Administered 2020-07-11: 10.9 via INTRAVENOUS
  Filled 2020-07-11: qty 11

## 2020-07-11 MED ORDER — TECHNETIUM TC 99M TETROFOSMIN IV KIT
31.7000 | PACK | Freq: Once | INTRAVENOUS | Status: AC | PRN
  Administered 2020-07-11: 31.7 via INTRAVENOUS
  Filled 2020-07-11: qty 32

## 2020-07-16 ENCOUNTER — Other Ambulatory Visit: Payer: Self-pay | Admitting: Family Medicine

## 2020-07-17 MED ORDER — GABAPENTIN 300 MG PO CAPS: ORAL_CAPSULE | ORAL | 0 refills | Status: DC

## 2020-07-17 NOTE — Telephone Encounter (Signed)
Please advise 

## 2020-07-18 ENCOUNTER — Other Ambulatory Visit (HOSPITAL_COMMUNITY): Payer: BC Managed Care – PPO

## 2020-07-24 ENCOUNTER — Telehealth: Payer: Self-pay | Admitting: Internal Medicine

## 2020-07-24 NOTE — Telephone Encounter (Signed)
[place] GET HER PROCEDURE RESCHEDULED, SHE HAS A WORK CONFLICT

## 2020-07-24 NOTE — Telephone Encounter (Signed)
Called pt, she wants to keep procedure as scheduled. She had got date mixed up for covid test.

## 2020-07-30 ENCOUNTER — Encounter: Payer: Self-pay | Admitting: Family Medicine

## 2020-07-30 MED ORDER — TRAMADOL HCL 50 MG PO TABS: 50.0000 mg | ORAL_TABLET | Freq: Two times a day (BID) | ORAL | 0 refills | Status: DC | PRN

## 2020-08-01 ENCOUNTER — Encounter: Payer: Self-pay | Admitting: Family Medicine

## 2020-08-01 MED ORDER — MECLIZINE HCL 25 MG PO TABS: 25.0000 mg | ORAL_TABLET | Freq: Three times a day (TID) | ORAL | 3 refills | Status: DC | PRN

## 2020-08-13 ENCOUNTER — Encounter (HOSPITAL_COMMUNITY): Payer: Self-pay

## 2020-08-15 ENCOUNTER — Other Ambulatory Visit: Payer: Self-pay

## 2020-08-15 ENCOUNTER — Other Ambulatory Visit (HOSPITAL_COMMUNITY)
Admission: RE | Admit: 2020-08-15 | Discharge: 2020-08-15 | Disposition: A | Discharge status: Home / Self Care | Payer: BC Managed Care – PPO | Source: Ambulatory Visit | Attending: Internal Medicine | Admitting: Internal Medicine

## 2020-08-15 DIAGNOSIS — K648 Other hemorrhoids: Secondary | ICD-10-CM | POA: Diagnosis not present

## 2020-08-15 DIAGNOSIS — Z87891 Personal history of nicotine dependence: Secondary | ICD-10-CM | POA: Diagnosis not present

## 2020-08-15 DIAGNOSIS — Z01812 Encounter for preprocedural laboratory examination: Secondary | ICD-10-CM | POA: Insufficient documentation

## 2020-08-15 DIAGNOSIS — D120 Benign neoplasm of cecum: Secondary | ICD-10-CM | POA: Diagnosis not present

## 2020-08-15 DIAGNOSIS — Z20822 Contact with and (suspected) exposure to covid-19: Secondary | ICD-10-CM | POA: Insufficient documentation

## 2020-08-15 DIAGNOSIS — Z79899 Other long term (current) drug therapy: Secondary | ICD-10-CM | POA: Diagnosis not present

## 2020-08-15 DIAGNOSIS — Z7984 Long term (current) use of oral hypoglycemic drugs: Secondary | ICD-10-CM | POA: Diagnosis not present

## 2020-08-15 DIAGNOSIS — Z881 Allergy status to other antibiotic agents status: Secondary | ICD-10-CM | POA: Diagnosis not present

## 2020-08-15 DIAGNOSIS — D123 Benign neoplasm of transverse colon: Secondary | ICD-10-CM | POA: Diagnosis not present

## 2020-08-15 DIAGNOSIS — Z1211 Encounter for screening for malignant neoplasm of colon: Secondary | ICD-10-CM | POA: Diagnosis not present

## 2020-08-16 ENCOUNTER — Encounter: Payer: Self-pay | Admitting: Family Medicine

## 2020-08-16 DIAGNOSIS — G8929 Other chronic pain: Secondary | ICD-10-CM

## 2020-08-16 DIAGNOSIS — M5440 Lumbago with sciatica, unspecified side: Secondary | ICD-10-CM

## 2020-08-16 DIAGNOSIS — M5441 Lumbago with sciatica, right side: Secondary | ICD-10-CM

## 2020-08-16 LAB — SARS CORONAVIRUS 2 (TAT 6-24 HRS): SARS Coronavirus 2: NEGATIVE

## 2020-08-16 MED ORDER — HYDROCODONE-ACETAMINOPHEN 5-325 MG PO TABS: 1.0000 | ORAL_TABLET | Freq: Two times a day (BID) | ORAL | 0 refills | Status: DC | PRN

## 2020-08-16 NOTE — Addendum Note (Signed)
Addended by: Hortencia Pilar on: 08/16/2020 08:13 AM   Modules accepted: Orders

## 2020-08-17 ENCOUNTER — Ambulatory Visit (HOSPITAL_COMMUNITY): Payer: BC Managed Care – PPO | Admitting: Anesthesiology

## 2020-08-17 ENCOUNTER — Encounter (HOSPITAL_COMMUNITY): Payer: Self-pay | Admitting: *Deleted

## 2020-08-17 ENCOUNTER — Encounter (HOSPITAL_COMMUNITY)
Admission: RE | Disposition: A | Discharge status: Home / Self Care | Payer: Self-pay | Source: Home / Self Care | Attending: Internal Medicine

## 2020-08-17 ENCOUNTER — Other Ambulatory Visit: Payer: Self-pay

## 2020-08-17 ENCOUNTER — Ambulatory Visit (HOSPITAL_COMMUNITY)
Admission: RE | Admit: 2020-08-17 | Discharge: 2020-08-17 | Disposition: A | Discharge status: Home / Self Care | Payer: BC Managed Care – PPO | Attending: Internal Medicine | Admitting: Internal Medicine

## 2020-08-17 DIAGNOSIS — Z1211 Encounter for screening for malignant neoplasm of colon: Secondary | ICD-10-CM

## 2020-08-17 DIAGNOSIS — Z79899 Other long term (current) drug therapy: Secondary | ICD-10-CM | POA: Diagnosis not present

## 2020-08-17 DIAGNOSIS — D123 Benign neoplasm of transverse colon: Secondary | ICD-10-CM | POA: Insufficient documentation

## 2020-08-17 DIAGNOSIS — Z7984 Long term (current) use of oral hypoglycemic drugs: Secondary | ICD-10-CM | POA: Diagnosis not present

## 2020-08-17 DIAGNOSIS — K635 Polyp of colon: Secondary | ICD-10-CM | POA: Diagnosis not present

## 2020-08-17 DIAGNOSIS — Z881 Allergy status to other antibiotic agents status: Secondary | ICD-10-CM | POA: Diagnosis not present

## 2020-08-17 DIAGNOSIS — D120 Benign neoplasm of cecum: Secondary | ICD-10-CM | POA: Diagnosis not present

## 2020-08-17 DIAGNOSIS — K648 Other hemorrhoids: Secondary | ICD-10-CM | POA: Insufficient documentation

## 2020-08-17 DIAGNOSIS — Z20822 Contact with and (suspected) exposure to covid-19: Secondary | ICD-10-CM | POA: Diagnosis not present

## 2020-08-17 DIAGNOSIS — Z87891 Personal history of nicotine dependence: Secondary | ICD-10-CM | POA: Diagnosis not present

## 2020-08-17 HISTORY — PX: POLYPECTOMY: SHX5525

## 2020-08-17 HISTORY — PX: COLONOSCOPY WITH PROPOFOL: SHX5780

## 2020-08-17 LAB — GLUCOSE, CAPILLARY: Glucose-Capillary: 90 mg/dL (ref 70–99)

## 2020-08-17 SURGERY — COLONOSCOPY WITH PROPOFOL
Anesthesia: General

## 2020-08-17 MED ORDER — LACTATED RINGERS IV SOLN
INTRAVENOUS | Status: DC
  Administered 2020-08-17: 1000 mL via INTRAVENOUS

## 2020-08-17 MED ORDER — PROPOFOL 10 MG/ML IV BOLUS
INTRAVENOUS | Status: DC | PRN
  Administered 2020-08-17 (×2): 50 mg via INTRAVENOUS
  Administered 2020-08-17: 100 mg via INTRAVENOUS

## 2020-08-17 MED ORDER — PROPOFOL 500 MG/50ML IV EMUL
INTRAVENOUS | Status: DC | PRN
  Administered 2020-08-17: 150 ug/kg/min via INTRAVENOUS

## 2020-08-17 NOTE — Anesthesia Postprocedure Evaluation (Signed)
Anesthesia Post Note  Patient: Crystal Perry  Procedure(s) Performed: COLONOSCOPY WITH PROPOFOL (N/A ) POLYPECTOMY  Patient location during evaluation: PACU Anesthesia Type: General Level of consciousness: awake and alert and oriented Pain management: pain level controlled Vital Signs Assessment: post-procedure vital signs reviewed and stable Respiratory status: spontaneous breathing Cardiovascular status: blood pressure returned to baseline Postop Assessment: no apparent nausea or vomiting Anesthetic complications: no   No complications documented.   Last Vitals:  Vitals:   08/17/20 0823 08/17/20 1031  BP: 135/75 105/71  Pulse: 60   Resp: 19 18  Temp: 36.6 C 36.9 C  SpO2: 98% 98%    Last Pain:  Vitals:   08/17/20 1031  TempSrc: Oral  PainSc: 0-No pain                 Crystal Perry
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
## 2020-08-17 NOTE — Anesthesia Preprocedure Evaluation (Addendum)
Anesthesia Evaluation  Patient identified by MRN, date of birth, ID band Patient awake    Reviewed: Allergy & Precautions, NPO status , Patient's Chart, lab work & pertinent test results  Airway Mallampati: III  TM Distance: >3 FB Neck ROM: Full    Dental  (+) Dental Advisory Given, Teeth Intact   Pulmonary former smoker,    Pulmonary exam normal breath sounds clear to auscultation       Cardiovascular Exercise Tolerance: Good Normal cardiovascular exam Rhythm:Regular Rate:Normal     Neuro/Psych  Headaches,  Neuromuscular disease negative psych ROS   GI/Hepatic negative GI ROS, Neg liver ROS,   Endo/Other  negative endocrine ROS  Renal/GU negative Renal ROS     Musculoskeletal  (+) Arthritis  (Back pain), Back pain    Abdominal   Peds  Hematology negative hematology ROS (+)   Anesthesia Other Findings   Reproductive/Obstetrics                            Anesthesia Physical Anesthesia Plan  ASA: II  Anesthesia Plan: General   Post-op Pain Management:    Induction: Intravenous  PONV Risk Score and Plan: Propofol infusion  Airway Management Planned: Nasal Cannula and Natural Airway  Additional Equipment:   Intra-op Plan:   Post-operative Plan:   Informed Consent: I have reviewed the patients History and Physical, chart, labs and discussed the procedure including the risks, benefits and alternatives for the proposed anesthesia with the patient or authorized representative who has indicated his/her understanding and acceptance.     Dental advisory given  Plan Discussed with: CRNA and Surgeon  Anesthesia Plan Comments:         Anesthesia Quick Evaluation

## 2020-08-17 NOTE — Op Note (Signed)
[facility] Patient Name: Crystal Perry Procedure Date: 08/17/2020 9:49 AM MRN: [id number] Date of Birth: 12-26-1970 Attending MD: Elon Alas. Abbey Chatters DO CSN: [id number] Age: 50 Admit Type: Outpatient Procedure:                Colonoscopy Indications:              Screening for colorectal malignant neoplasm Providers:                Elon Alas. Abbey Chatters, DO, Gwenlyn Fudge, RN, Aram Candela Referring MD:              Medicines:                See the Anesthesia note for documentation of the                            administered medications Complications:            No immediate complications. Estimated Blood Loss:     Estimated blood loss was minimal. Procedure:                Pre-Anesthesia Assessment:                           - The anesthesia plan was to use monitored                            anesthesia care (MAC).                           After obtaining informed consent, the colonoscope                            was passed under direct vision. Throughout the                            procedure, the patient's blood pressure, pulse, and                            oxygen saturations were monitored continuously. The                            [id number]-H190DL ([id number]) scope was introduced through                            the anus and advanced to the the cecum, identified                            by appendiceal orifice and ileocecal valve. The                            colonoscopy was performed without difficulty. The                            patient tolerated the procedure well. The quality  of the bowel preparation was evaluated using the                            BBPS [facility] Bowel Preparation Scale) with scores                            of: Right Colon = 2 (minor amount of residual                            staining, small fragments of stool and/or opaque                            liquid, but mucosa seen well),  Transverse Colon = 3                            (entire mucosa seen well with no residual staining,                            small fragments of stool or opaque liquid) and Left                            Colon = 3 (entire mucosa seen well with no residual                            staining, small fragments of stool or opaque                            liquid). The total BBPS score equals 8. The quality                            of the bowel preparation was good. Scope In: 10:07:54 AM Scope Out: 10:24:40 AM Scope Withdrawal Time: 0 hours 15 minutes 16 seconds  Total Procedure Duration: 0 hours 16 minutes 46 seconds  Findings:      The perianal and digital rectal examinations were normal.      Non-bleeding internal hemorrhoids were found during endoscopy.      A 2 mm polyp was found in the cecum. The polyp was sessile. The polyp       was removed with a cold biopsy forceps. Resection and retrieval were       complete.      Two sessile polyps were found in the transverse colon. The polyps were 1       to 2 mm in size. These polyps were removed with a cold biopsy forceps.       Resection and retrieval were complete.      The exam was otherwise without abnormality. Impression:               - Non-bleeding internal hemorrhoids.                           - One 2 mm polyp in the cecum, removed with a cold  biopsy forceps. Resected and retrieved.                           - Two 1 to 2 mm polyps in the transverse colon,                            removed with a cold biopsy forceps. Resected and                            retrieved.                           - The examination was otherwise normal. Moderate Sedation:      Per Anesthesia Care Recommendation:           - Patient has a contact number available for                            emergencies. The signs and symptoms of potential                            delayed complications were discussed with the                             patient. Return to normal activities tomorrow.                            Written discharge instructions were provided to the                            patient.                           - Resume previous diet.                           - Continue present medications.                           - Await pathology results.                           - Repeat colonoscopy in 5-10 years for surveillance                            depedning on pathology.                           - Return to GI clinic PRN. Procedure Code(s):        --- Professional ---                           [id number], Colonoscopy, flexible; with biopsy, single                            or multiple Diagnosis Code(s):        --- Professional ---  K63.5, Polyp of colon                           Z12.11, Encounter for screening for malignant                            neoplasm of colon                           K64.8, Other hemorrhoids CPT copyright 2019 American Medical Association. All rights reserved. The codes documented in this report are preliminary and upon coder review may  be revised to meet current compliance requirements. Elon Alas. Abbey Chatters, DO [place] Abbey Chatters, DO 08/17/2020 10:37:45 AM This report has been signed electronically. Number of Addenda: 0

## 2020-08-17 NOTE — Discharge Instructions (Addendum)
Colon Polyps  Colon polyps are tissue growths inside the colon, which is part of the large intestine. They are one of the types of polyps that can grow in the body. A polyp may be a round bump or a mushroom-shaped growth. You could have one polyp or more than one. Most colon polyps are noncancerous (benign). However, some colon polyps can become cancerous over time. Finding and removing the polyps early can help prevent this. What are the causes? The exact cause of colon polyps is not known. What increases the risk? The following factors may make you more likely to develop this condition:  Having a family history of colorectal cancer or colon polyps.  Being older than 51 years of age.  Being younger than 51 years of age and having a significant family history of colorectal cancer or colon polyps or a genetic condition that puts you at higher risk of getting colon polyps.  Having inflammatory bowel disease, such as ulcerative colitis or Crohn's disease.  Having certain conditions passed from parent to child (hereditary conditions), such as: ? Familial adenomatous polyposis (FAP). ? Lynch syndrome. ? Turcot syndrome. ? Peutz-Jeghers syndrome. ? MUTYH-associated polyposis (MAP).  Being overweight.  Certain lifestyle factors. These include smoking cigarettes, drinking too much alcohol, not getting enough exercise, and eating a diet that is high in fat and red meat and low in fiber.  Having had childhood cancer that was treated with radiation of the abdomen. What are the signs or symptoms? Many times, there are no symptoms. If you have symptoms, they may include:  Blood coming from the rectum during a bowel movement.  Blood in the stool (feces). The blood may be bright red or very dark in color.  Pain in the abdomen.  A change in bowel habits, such as constipation or diarrhea. How is this diagnosed? This condition is diagnosed with a colonoscopy. This is a procedure in which a  lighted, flexible scope is inserted into the opening between the buttocks (anus) and then passed into the colon to examine the area. Polyps are sometimes found when a colonoscopy is done as part of routine cancer screening tests. How is this treated? This condition is treated by removing any polyps that are found. Most polyps can be removed during a colonoscopy. Those polyps will then be tested for cancer. Additional treatment may be needed depending on the results of testing. Follow these instructions at home: Eating and drinking  Eat foods that are high in fiber, such as fruits, vegetables, and whole grains.  Eat foods that are high in calcium and vitamin D, such as milk, cheese, yogurt, eggs, liver, fish, and broccoli.  Limit foods that are high in fat, such as fried foods and desserts.  Limit the amount of red meat, precooked or cured meat, or other processed meat that you eat, such as hot dogs, sausages, bacon, or meat loaves.  Limit sugary drinks.   Lifestyle  Maintain a healthy weight, or lose weight if recommended by your health care provider.  Exercise every day or as told by your health care provider.  Do not use any products that contain nicotine or tobacco, such as cigarettes, e-cigarettes, and chewing tobacco. If you need help quitting, ask your health care provider.  Do not drink alcohol if: ? Your health care provider tells you not to drink. ? You are pregnant, may be pregnant, or are planning to become pregnant.  If you drink alcohol: ? Limit how much you use to:  0-1 drink a day for women.  0-2 drinks a day for men. ? Know how much alcohol is in your drink. In the [place] one drink equals one 12 oz bottle of beer (355 mL), one 5 oz glass of wine (148 mL), or one 1 oz glass of hard liquor (44 mL). General instructions  Take over-the-counter and prescription medicines only as told by your health care provider.  Keep all follow-up visits. This is important. This  includes having regularly scheduled colonoscopies. Talk to your health care provider about when you need a colonoscopy. Contact a health care provider if:  You have new or worsening bleeding during a bowel movement.  You have new or increased blood in your stool.  You have a change in bowel habits.  You lose weight for no known reason. Summary  Colon polyps are tissue growths inside the colon, which is part of the large intestine. They are one type of polyp that can grow in the body.  Most colon polyps are noncancerous (benign), but some can become cancerous over time.  This condition is diagnosed with a colonoscopy.  This condition is treated by removing any polyps that are found. Most polyps can be removed during a colonoscopy. This information is not intended to replace advice given to you by your health care provider. Make sure you discuss any questions you have with your health care provider. Document Revised: 09/14/2019 Document Reviewed: 09/14/2019 Elsevier Patient Education  2021 [place].  Colonoscopy Discharge Instructions  Read the instructions outlined below and refer to this sheet in the next few weeks. These discharge instructions provide you with general information on caring for yourself after you leave the hospital. Your doctor may also give you specific instructions. While your treatment has been planned according to the most current medical practices available, unavoidable complications occasionally occur.   ACTIVITY  You may resume your regular activity, but move at a slower pace for the next 24 hours.   Take frequent rest periods for the next 24 hours.   Walking will help get rid of the air and reduce the bloated feeling in your belly (abdomen).   No driving for 24 hours (because of the medicine (anesthesia) used during the test).    Do not sign any important legal documents or operate any machinery for 24 hours (because of the anesthesia used during  the test).  NUTRITION  Drink plenty of fluids.   You may resume your normal diet as instructed by your doctor.   Begin with a light meal and progress to your normal diet. Heavy or fried foods are harder to digest and may make you feel sick to your stomach (nauseated).   Avoid alcoholic beverages for 24 hours or as instructed.  MEDICATIONS  You may resume your normal medications unless your doctor tells you otherwise.  WHAT YOU CAN EXPECT TODAY  Some feelings of bloating in the abdomen.   Passage of more gas than usual.   Spotting of blood in your stool or on the toilet paper.  IF YOU HAD POLYPS REMOVED DURING THE COLONOSCOPY:  No aspirin products for 7 days or as instructed.   No alcohol for 7 days or as instructed.   Eat a soft diet for the next 24 hours.  FINDING OUT THE RESULTS OF YOUR TEST Not all test results are available during your visit. If your test results are not back during the visit, make an appointment with your caregiver to find out the results. Do  not assume everything is normal if you have not heard from your caregiver or the medical facility. It is important for you to follow up on all of your test results.  SEEK IMMEDIATE MEDICAL ATTENTION IF:  You have more than a spotting of blood in your stool.   Your belly is swollen (abdominal distention).   You are nauseated or vomiting.   You have a temperature over 101.   You have abdominal pain or discomfort that is severe or gets worse throughout the day.   Your colonoscopy revealed 3 polyp(s) which I removed successfully. Await pathology results, my office will contact you. I recommend repeating colonoscopy in 5-10 years for surveillance purposes depending on pathology. Otherwise follow up with GI as needed.    I hope you have a great rest of your week!  Elon Alas. Abbey Chatters, D.O. Gastroenterology and Hepatology [facility]

## 2020-08-17 NOTE — Transfer of Care (Signed)
Immediate Anesthesia Transfer of Care Note  Patient: Crystal Perry  Procedure(s) Performed: COLONOSCOPY WITH PROPOFOL (N/A ) POLYPECTOMY  Patient Location: Endoscopy Unit  Anesthesia Type:General  Level of Consciousness: awake  Airway & Oxygen Therapy: Patient Spontanous Breathing  Post-op Assessment: Report given to RN  Post vital signs: Reviewed and stable  Last Vitals:  Vitals Value Taken Time  BP 105/71 08/17/20 1031  Temp 36.9 C 08/17/20 1031  Pulse    Resp 18 08/17/20 1031  SpO2 98 % 08/17/20 1031    Last Pain:  Vitals:   08/17/20 1031  TempSrc: Oral  PainSc: 0-No pain      Patients Stated Pain Goal: 7 (12/87/86 7672)  Complications: No complications documented.

## 2020-08-20 LAB — SURGICAL PATHOLOGY

## 2020-08-22 ENCOUNTER — Encounter (HOSPITAL_COMMUNITY): Payer: Self-pay | Admitting: Internal Medicine

## 2020-08-28 ENCOUNTER — Encounter: Payer: Self-pay | Admitting: Physical Medicine & Rehabilitation

## 2020-09-03 ENCOUNTER — Encounter: Payer: Self-pay | Admitting: Family Medicine

## 2020-09-04 ENCOUNTER — Telehealth: Payer: Self-pay | Admitting: Family Medicine

## 2020-09-04 NOTE — Telephone Encounter (Signed)
[employer] forms received. Sent to [employer] 

## 2020-09-10 ENCOUNTER — Encounter: Payer: Self-pay | Admitting: Family Medicine

## 2020-09-10 MED ORDER — GABAPENTIN 300 MG PO CAPS: 300.0000 mg | ORAL_CAPSULE | Freq: Three times a day (TID) | ORAL | 3 refills | Status: DC

## 2020-09-10 MED ORDER — HYDROCODONE-ACETAMINOPHEN 5-325 MG PO TABS: 1.0000 | ORAL_TABLET | Freq: Two times a day (BID) | ORAL | 0 refills | Status: DC | PRN

## 2020-09-28 ENCOUNTER — Encounter: Payer: Self-pay | Admitting: Family Medicine

## 2020-09-28 ENCOUNTER — Encounter: Payer: Self-pay | Admitting: Physical Medicine & Rehabilitation

## 2020-09-28 ENCOUNTER — Encounter
Payer: BC Managed Care – PPO | Attending: Physical Medicine & Rehabilitation | Admitting: Physical Medicine & Rehabilitation

## 2020-09-28 ENCOUNTER — Other Ambulatory Visit: Payer: Self-pay

## 2020-09-28 VITALS — BP 128/86 | HR 72 | Temp 98.2°F | Ht 64.0 in | Wt 202.6 lb

## 2020-09-28 DIAGNOSIS — M47817 Spondylosis without myelopathy or radiculopathy, lumbosacral region: Secondary | ICD-10-CM | POA: Insufficient documentation

## 2020-09-28 MED ORDER — HYDROCODONE-ACETAMINOPHEN 5-325 MG PO TABS: 1.0000 | ORAL_TABLET | Freq: Two times a day (BID) | ORAL | 0 refills | Status: DC | PRN

## 2020-09-28 MED ORDER — DIAZEPAM 5 MG PO TABS: 10.0000 mg | ORAL_TABLET | Freq: Once | ORAL | 0 refills | Status: AC
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
## 2020-09-28 NOTE — Patient Instructions (Addendum)
Plan for lumbar medial branch blocks Back Exercises These exercises help to make your trunk and back strong. They also help to keep the lower back flexible. Doing these exercises can help to prevent back pain or lessen existing pain.  If you have back pain, try to do these exercises 2-3 times each day or as told by your doctor.  As you get better, do the exercises once each day. Repeat the exercises more often as told by your doctor.  To stop back pain from coming back, do the exercises once each day, or as told by your doctor. Exercises Single knee to chest Do these steps 3-5 times in a row for each leg: 1. Lie on your back on a firm bed or the floor with your legs stretched out. 2. Bring one knee to your chest. 3. Grab your knee or thigh with both hands and hold them it in place. 4. Pull on your knee until you feel a gentle stretch in your lower back or buttocks. 5. Keep doing the stretch for 10-30 seconds. 6. Slowly let go of your leg and straighten it. Pelvic tilt Do these steps 5-10 times in a row: 1. Lie on your back on a firm bed or the floor with your legs stretched out. 2. Bend your knees so they point up to the ceiling. Your feet should be flat on the floor. 3. Tighten your lower belly (abdomen) muscles to press your lower back against the floor. This will make your tailbone point up to the ceiling instead of pointing down to your feet or the floor. 4. Stay in this position for 5-10 seconds while you gently tighten your muscles and breathe evenly. Cat-cow Do these steps until your lower back bends more easily: 1. Get on your hands and knees on a firm surface. Keep your hands under your shoulders, and keep your knees under your hips. You may put padding under your knees. 2. Let your head hang down toward your chest. Tighten (contract) the muscles in your belly. Point your tailbone toward the floor so your lower back becomes rounded like the back of a cat. 3. Stay in this position  for 5 seconds. 4. Slowly lift your head. Let the muscles of your belly relax. Point your tailbone up toward the ceiling so your back forms a sagging arch like the back of a cow. 5. Stay in this position for 5 seconds.   Press-ups Do these steps 5-10 times in a row: 1. Lie on your belly (face-down) on the floor. 2. Place your hands near your head, about shoulder-width apart. 3. While you keep your back relaxed and keep your hips on the floor, slowly straighten your arms to raise the top half of your body and lift your shoulders. Do not use your back muscles. You may change where you place your hands in order to make yourself more comfortable. 4. Stay in this position for 5 seconds. 5. Slowly return to lying flat on the floor.   Bridges Do these steps 10 times in a row: 1. Lie on your back on a firm surface. 2. Bend your knees so they point up to the ceiling. Your feet should be flat on the floor. Your arms should be flat at your sides, next to your body. 3. Tighten your butt muscles and lift your butt off the floor until your waist is almost as high as your knees. If you do not feel the muscles working in your butt and the back  of your thighs, slide your feet 1-2 inches farther away from your butt. 4. Stay in this position for 3-5 seconds. 5. Slowly lower your butt to the floor, and let your butt muscles relax. If this exercise is too easy, try doing it with your arms crossed over your chest.   Belly crunches Do these steps 5-10 times in a row: 1. Lie on your back on a firm bed or the floor with your legs stretched out. 2. Bend your knees so they point up to the ceiling. Your feet should be flat on the floor. 3. Cross your arms over your chest. 4. Tip your chin a little bit toward your chest but do not bend your neck. 5. Tighten your belly muscles and slowly raise your chest just enough to lift your shoulder blades a tiny bit off of the floor. Avoid raising your body higher than that, because  it can put too much stress on your low back. 6. Slowly lower your chest and your head to the floor. Back lifts Do these steps 5-10 times in a row: 1. Lie on your belly (face-down) with your arms at your sides, and rest your forehead on the floor. 2. Tighten the muscles in your legs and your butt. 3. Slowly lift your chest off of the floor while you keep your hips on the floor. Keep the back of your head in line with the curve in your back. Look at the floor while you do this. 4. Stay in this position for 3-5 seconds. 5. Slowly lower your chest and your face to the floor. Contact a doctor if:  Your back pain gets a lot worse when you do an exercise.  Your back pain does not get better 2 hours after you exercise. If you have any of these problems, stop doing the exercises. Do not do them again unless your doctor says it is okay. Get help right away if:  You have sudden, very bad back pain. If this happens, stop doing the exercises. Do not do them again unless your doctor says it is okay. This information is not intended to replace advice given to you by your health care provider. Make sure you discuss any questions you have with your health care provider. Document Revised: 02/18/2018 Document Reviewed: 02/18/2018 Elsevier Patient Education  2021 [employer].

## 2020-10-05 IMAGING — MG DIGITAL SCREENING BILAT W/ TOMO W/ CAD
8 series · 8 of 24 positions shown · non-contrast
Comparison: None.

CLINICAL DATA: Screening.

EXAM:
DIGITAL SCREENING BILATERAL MAMMOGRAM WITH TOMO AND CAD

[R CC synth-2D]
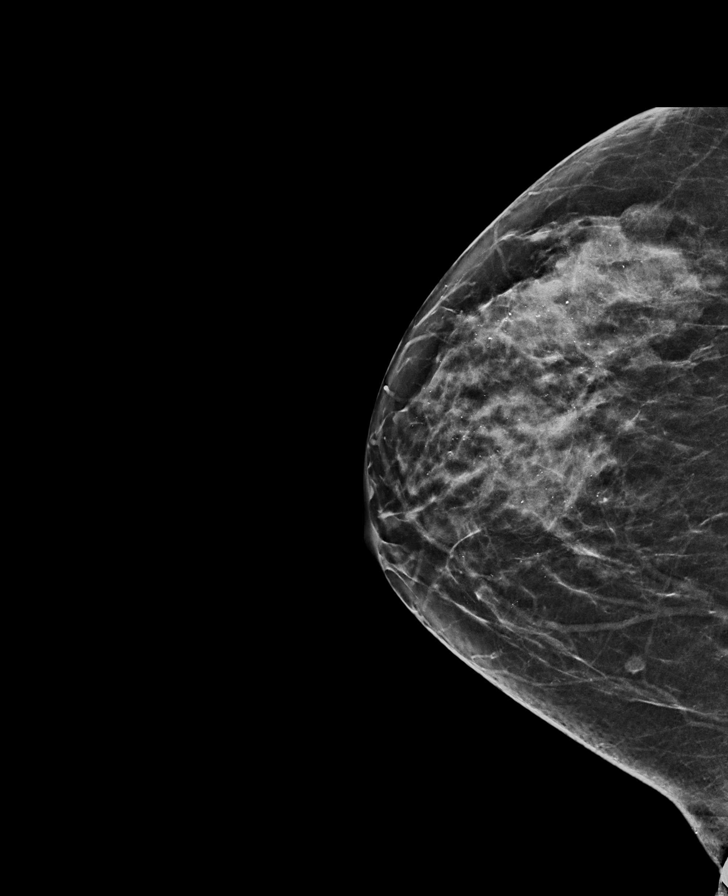

[L CC synth-2D]
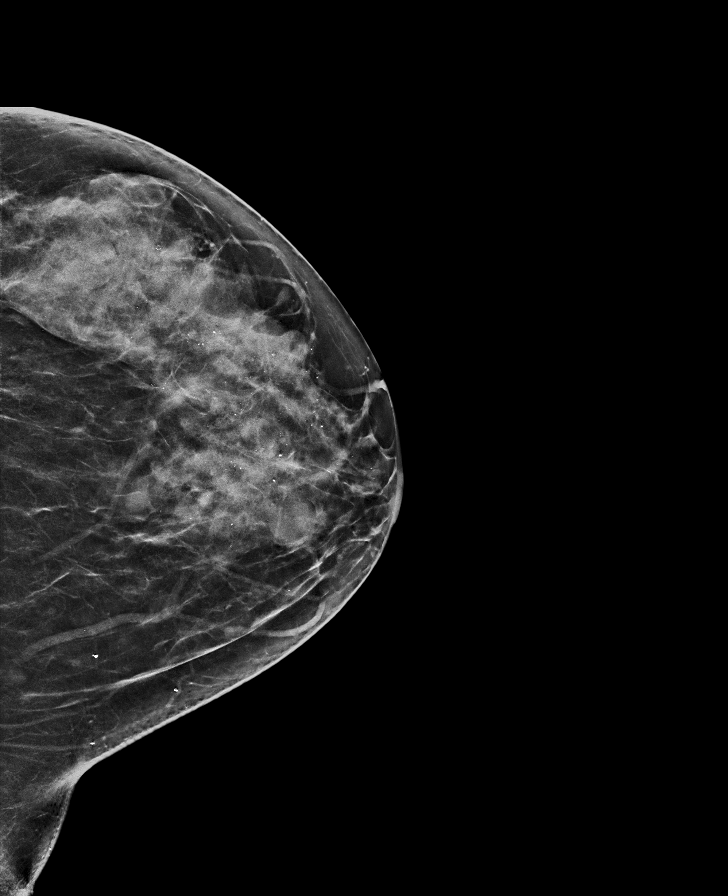

[L MLO synth-2D]
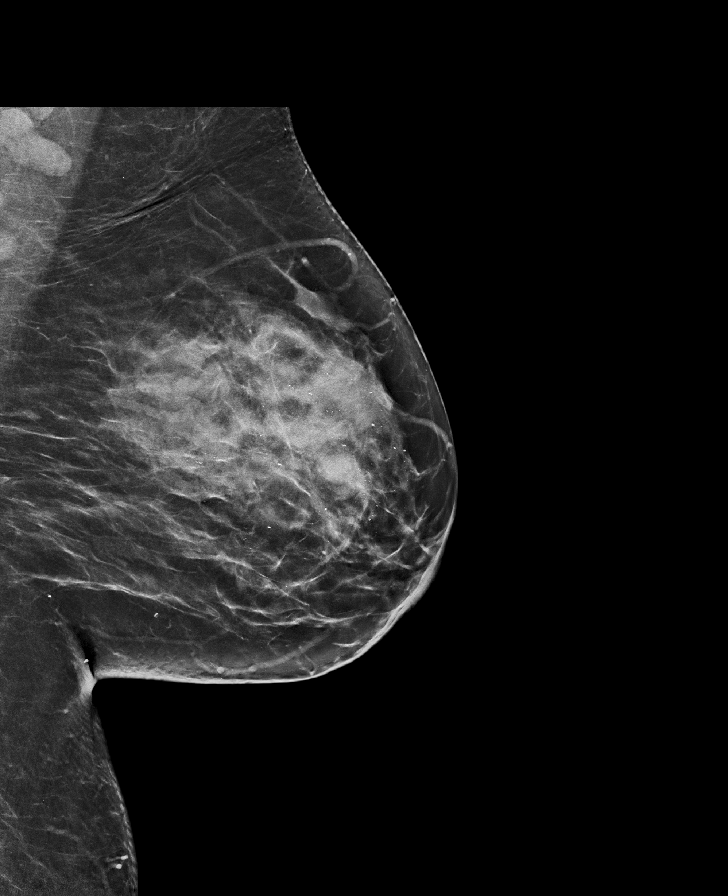

[R MLO synth-2D]
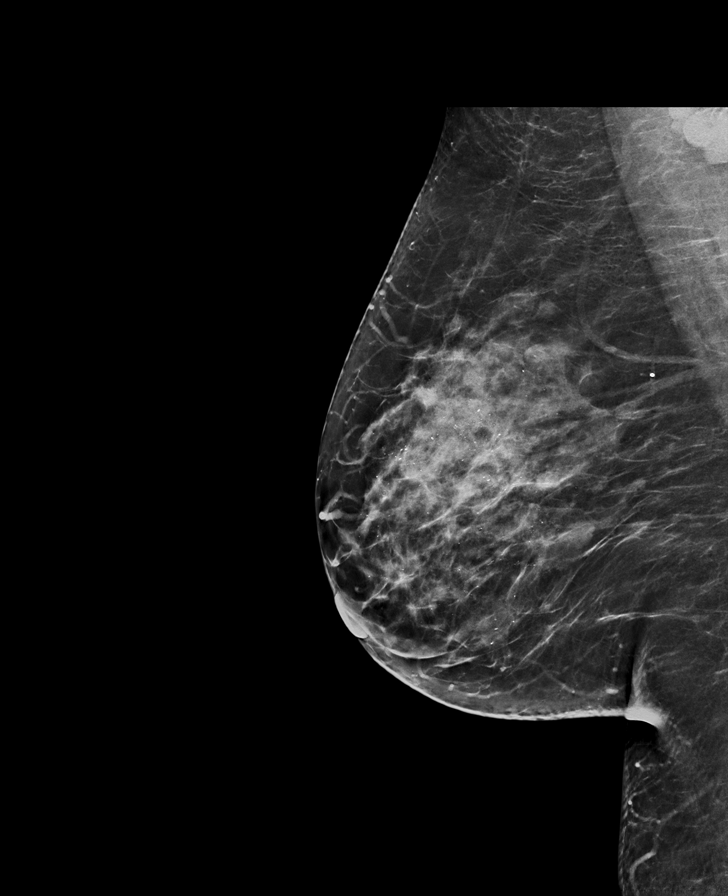

[L MLO tomo · tomo slice 39/77.0]
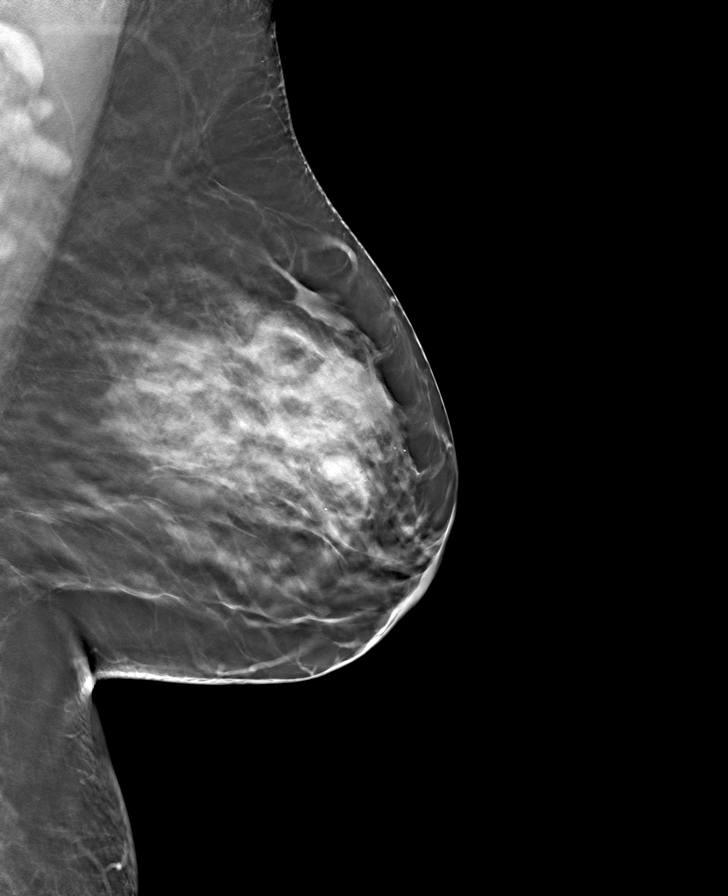

[R MLO tomo · tomo slice 37/74.0]
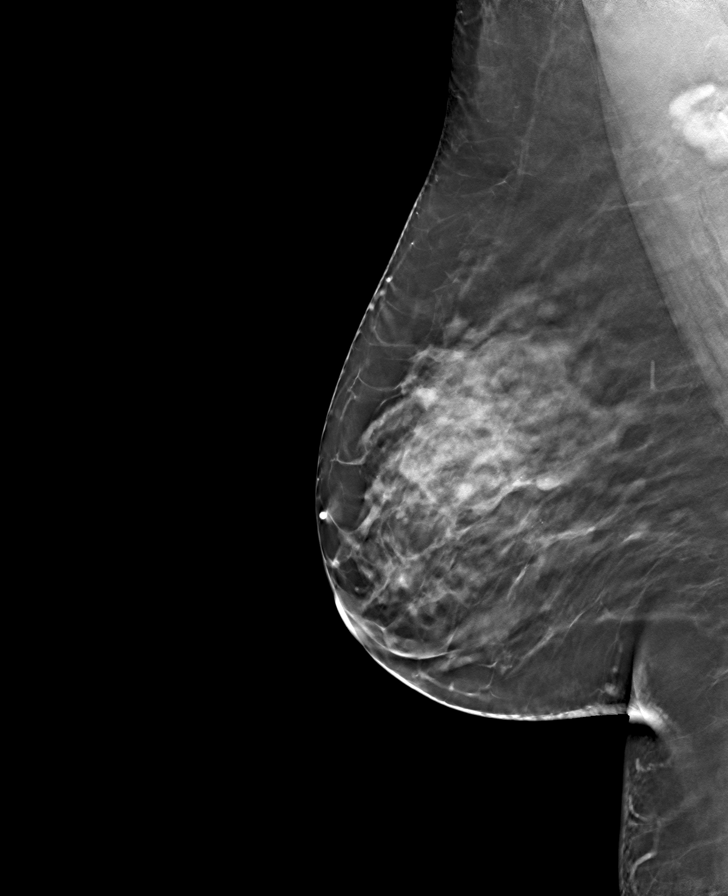

[L CC tomo · tomo slice 33/64.0]
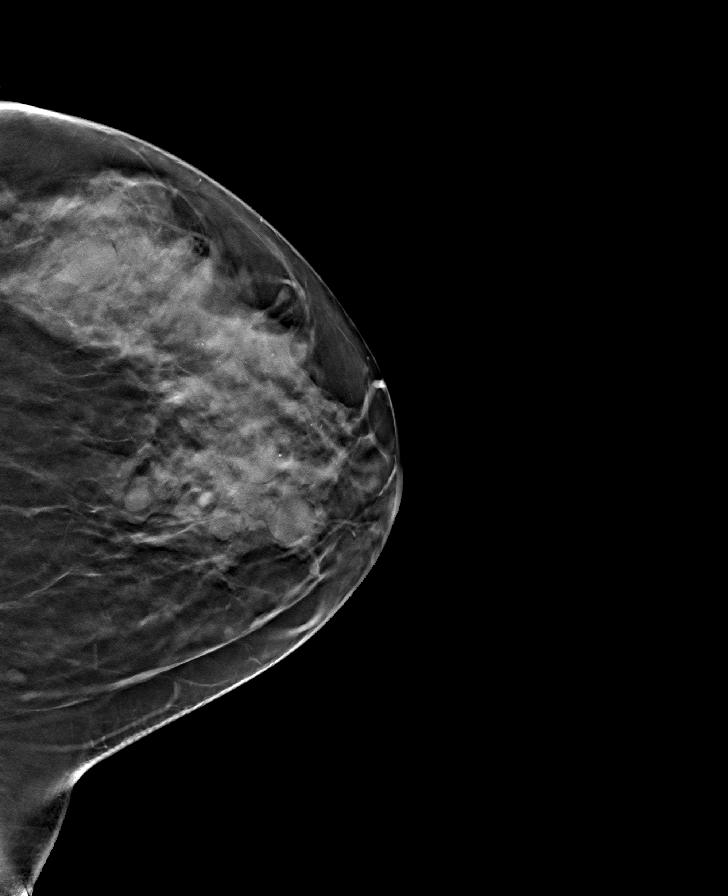

[R CC tomo · tomo slice 31/62.0]
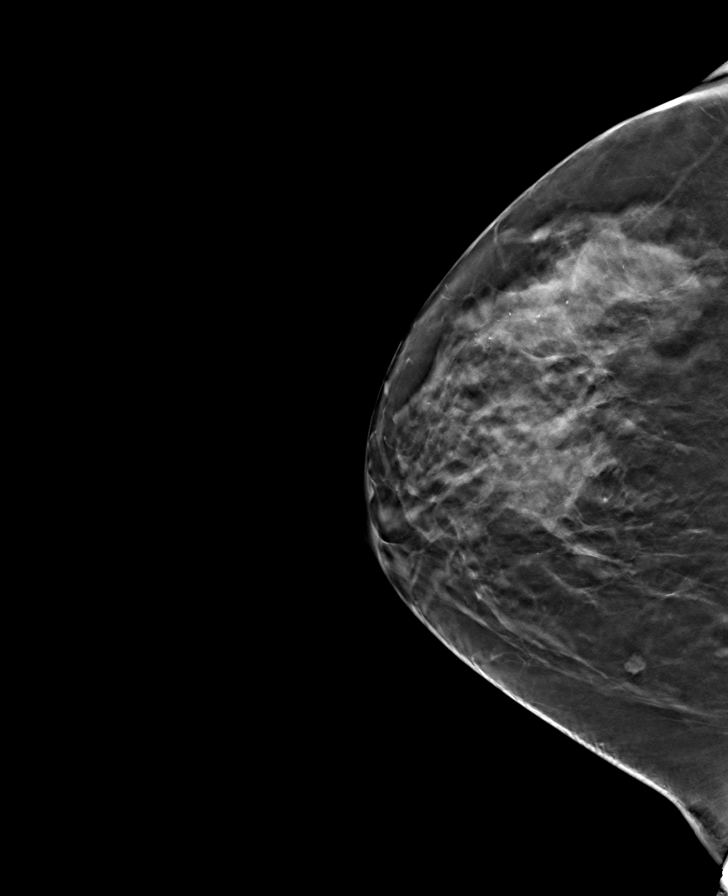

[8 of 24 positions shown; findings below may reference images not displayed]

ACR Breast Density Category c: The breast tissue is heterogeneously
dense, which may obscure small masses.
FINDINGS: In the right breast, possible distortion and calcifications warrant
further evaluation. In the left breast, no findings suspicious for
malignancy. Images were processed with CAD.
IMPRESSION: Further evaluation is suggested for a possible distortion and
calcifications in the right breast.

RECOMMENDATION:
Diagnostic mammogram and possibly ultrasound of the right breast.
(Code:[id number])

The patient will be contacted regarding the findings, and additional
imaging will be scheduled.

BI-RADS CATEGORY  0: Incomplete. Need additional imaging evaluation
and/or prior mammograms for comparison.

## 2020-10-12 ENCOUNTER — Encounter: Payer: BC Managed Care – PPO | Admitting: Physical Medicine & Rehabilitation

## 2020-11-28 DIAGNOSIS — Z1151 Encounter for screening for human papillomavirus (HPV): Secondary | ICD-10-CM | POA: Diagnosis not present

## 2020-11-28 DIAGNOSIS — Z130 Encounter for screening for diseases of the blood and blood-forming organs and certain disorders involving the immune mechanism: Secondary | ICD-10-CM | POA: Diagnosis not present

## 2020-11-28 DIAGNOSIS — Z13220 Encounter for screening for lipoid disorders: Secondary | ICD-10-CM | POA: Diagnosis not present

## 2020-11-28 DIAGNOSIS — Z1329 Encounter for screening for other suspected endocrine disorder: Secondary | ICD-10-CM | POA: Diagnosis not present

## 2020-11-28 DIAGNOSIS — Z0000 Encounter for general adult medical examination without abnormal findings: Secondary | ICD-10-CM | POA: Diagnosis not present

## 2020-11-28 DIAGNOSIS — Z131 Encounter for screening for diabetes mellitus: Secondary | ICD-10-CM | POA: Diagnosis not present

## 2020-12-12 ENCOUNTER — Encounter: Payer: Self-pay | Admitting: Family Medicine

## 2020-12-12 ENCOUNTER — Telehealth: Payer: Self-pay | Admitting: Family Medicine

## 2020-12-12 MED ORDER — PROMETHAZINE HCL 25 MG PO TABS: 25.0000 mg | ORAL_TABLET | Freq: Four times a day (QID) | ORAL | 0 refills | Status: DC | PRN

## 2020-12-12 MED ORDER — DIPHENOXYLATE-ATROPINE 2.5-0.025 MG PO TABS: 1.0000 | ORAL_TABLET | Freq: Four times a day (QID) | ORAL | 0 refills | Status: DC | PRN

## 2020-12-12 NOTE — Telephone Encounter (Signed)
Please advise 

## 2020-12-12 NOTE — Telephone Encounter (Signed)
The patient was advised of the written note through [facility].

## 2020-12-12 NOTE — Telephone Encounter (Signed)
Patient called advised she has had nausea,diarrhea and throwing up since 12/06/2020. Patient asked if she can make an appointment for Dr. Junius Roads to see her? Patient said she took a Covid-19 test and it was negative. The number to contact patient is [phone]

## 2020-12-12 NOTE — Telephone Encounter (Signed)
I called the patient and advised her of the meds that were sent to her pharmacy and plan for if she does not improve.  She left work early yesterday, due to sickness --- requesting a note for yesterday and today. Ok?

## 2021-01-01 ENCOUNTER — Other Ambulatory Visit: Payer: Self-pay

## 2021-01-01 ENCOUNTER — Encounter: Payer: BC Managed Care – PPO | Attending: Family Medicine | Admitting: Nutrition

## 2021-01-01 DIAGNOSIS — E119 Type 2 diabetes mellitus without complications: Secondary | ICD-10-CM | POA: Insufficient documentation

## 2021-01-08 ENCOUNTER — Ambulatory Visit: Payer: BC Managed Care – PPO | Admitting: Nutrition

## 2021-01-09 ENCOUNTER — Ambulatory Visit: Payer: BC Managed Care – PPO | Admitting: Family Medicine

## 2021-01-12 DIAGNOSIS — S61211A Laceration without foreign body of left index finger without damage to nail, initial encounter: Secondary | ICD-10-CM | POA: Diagnosis not present

## 2021-01-12 DIAGNOSIS — S61211D Laceration without foreign body of left index finger without damage to nail, subsequent encounter: Secondary | ICD-10-CM | POA: Diagnosis not present

## 2021-01-12 DIAGNOSIS — Z4802 Encounter for removal of sutures: Secondary | ICD-10-CM | POA: Diagnosis not present

## 2021-01-13 DIAGNOSIS — S61211D Laceration without foreign body of left index finger without damage to nail, subsequent encounter: Secondary | ICD-10-CM | POA: Diagnosis not present

## 2021-01-13 DIAGNOSIS — Z4802 Encounter for removal of sutures: Secondary | ICD-10-CM | POA: Diagnosis not present

## 2021-01-14 DIAGNOSIS — L02512 Cutaneous abscess of left hand: Secondary | ICD-10-CM | POA: Diagnosis not present

## 2021-01-14 DIAGNOSIS — S61211D Laceration without foreign body of left index finger without damage to nail, subsequent encounter: Secondary | ICD-10-CM | POA: Diagnosis not present

## 2021-01-15 ENCOUNTER — Other Ambulatory Visit: Payer: Self-pay

## 2021-01-15 ENCOUNTER — Encounter (HOSPITAL_COMMUNITY): Payer: Self-pay

## 2021-01-15 ENCOUNTER — Emergency Department (HOSPITAL_COMMUNITY)
Admission: EM | Admit: 2021-01-15 | Discharge: 2021-01-15 | Disposition: A | Discharge status: Home / Self Care | Payer: BC Managed Care – PPO | Attending: Emergency Medicine | Admitting: Emergency Medicine

## 2021-01-15 DIAGNOSIS — Z7984 Long term (current) use of oral hypoglycemic drugs: Secondary | ICD-10-CM | POA: Diagnosis not present

## 2021-01-15 DIAGNOSIS — L089 Local infection of the skin and subcutaneous tissue, unspecified: Secondary | ICD-10-CM

## 2021-01-15 DIAGNOSIS — W260XXA Contact with knife, initial encounter: Secondary | ICD-10-CM | POA: Diagnosis not present

## 2021-01-15 DIAGNOSIS — S6992XA Unspecified injury of left wrist, hand and finger(s), initial encounter: Secondary | ICD-10-CM | POA: Diagnosis not present

## 2021-01-15 DIAGNOSIS — R7303 Prediabetes: Secondary | ICD-10-CM | POA: Diagnosis not present

## 2021-01-15 DIAGNOSIS — F17210 Nicotine dependence, cigarettes, uncomplicated: Secondary | ICD-10-CM | POA: Insufficient documentation

## 2021-01-15 DIAGNOSIS — L02512 Cutaneous abscess of left hand: Secondary | ICD-10-CM | POA: Diagnosis not present

## 2021-01-15 DIAGNOSIS — S61211A Laceration without foreign body of left index finger without damage to nail, initial encounter: Secondary | ICD-10-CM | POA: Insufficient documentation

## 2021-01-15 DIAGNOSIS — S61211D Laceration without foreign body of left index finger without damage to nail, subsequent encounter: Secondary | ICD-10-CM | POA: Diagnosis not present

## 2021-01-15 MED ORDER — POVIDONE-IODINE 10 % EX SOLN
CUTANEOUS | Status: AC
  Filled 2021-01-15: qty 15

## 2021-01-15 NOTE — Discharge Instructions (Addendum)
Finish the antibiotics you are currently taking.  I recommend a warm Epson salt soak of this wound twice daily for 10 minutes.  You may keep this wound uncovered if you are careful with this wound.  However if you are doing any activities where it may get soiled or injured further keep it covered as discussed.  If you develop any increasing pain, swelling, redness or drainage of pus please get reevaluated immediately.  Dr. Aline Brochure there is our local orthopedist and hand specialist.  If you cannot see him expediently return here for a recheck of your finger infection.

## 2021-01-15 NOTE — ED Triage Notes (Signed)
Pt presents to ED with left index finger injury happened on Saturday. Pt was seen at [facility] and wants second opinion. Pt states finger is more swollen and painful since seen at [facility].
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
## 2021-01-17 ENCOUNTER — Telehealth: Payer: Self-pay | Admitting: Orthopedic Surgery

## 2021-01-17 NOTE — Telephone Encounter (Signed)
Patient called to schedule an appointment with Dr. Aline Brochure.  She has been to [facility] ER twice and came to [facility] ER. Abigail Butts revived the account and she needs to go to a hand specialist.    I advised the patient of this and gave her the number and address to [facility] in [place].   She said thank you and she will call them.

## 2021-02-27 ENCOUNTER — Telehealth: Payer: Self-pay | Admitting: Family Medicine

## 2021-02-27 NOTE — Telephone Encounter (Signed)
Received call from patient. Requests records release form to be mailed to her. Placed in the mail per her request.

## 2021-03-07 ENCOUNTER — Encounter: Payer: Self-pay | Admitting: Orthopaedic Surgery

## 2021-03-07 ENCOUNTER — Ambulatory Visit (INDEPENDENT_AMBULATORY_CARE_PROVIDER_SITE_OTHER): Payer: BC Managed Care – PPO | Admitting: Orthopaedic Surgery

## 2021-03-07 ENCOUNTER — Other Ambulatory Visit: Payer: Self-pay

## 2021-03-07 ENCOUNTER — Telehealth: Payer: Self-pay | Admitting: Orthopaedic Surgery

## 2021-03-07 VITALS — Ht 63.0 in | Wt 202.0 lb

## 2021-03-07 DIAGNOSIS — G8929 Other chronic pain: Secondary | ICD-10-CM | POA: Diagnosis not present

## 2021-03-07 DIAGNOSIS — M5440 Lumbago with sciatica, unspecified side: Secondary | ICD-10-CM | POA: Diagnosis not present

## 2021-03-07 MED ORDER — GABAPENTIN 300 MG PO CAPS: 300.0000 mg | ORAL_CAPSULE | Freq: Three times a day (TID) | ORAL | 3 refills | Status: DC

## 2021-03-07 MED ORDER — METHOCARBAMOL 500 MG PO TABS: 500.0000 mg | ORAL_TABLET | Freq: Two times a day (BID) | ORAL | 0 refills | Status: DC | PRN
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
## 2021-03-07 NOTE — Telephone Encounter (Signed)
Received medical records release form from patient.     Patient is requesting medical records to be sent to     [facility]     Fax# [phone]

## 2021-03-08 ENCOUNTER — Ambulatory Visit: Payer: BC Managed Care – PPO | Admitting: Orthopaedic Surgery

## 2021-03-08 DIAGNOSIS — G8929 Other chronic pain: Secondary | ICD-10-CM

## 2021-03-08 DIAGNOSIS — M5440 Lumbago with sciatica, unspecified side: Secondary | ICD-10-CM

## 2021-03-12 ENCOUNTER — Telehealth: Payer: Self-pay | Admitting: Family Medicine

## 2021-03-12 NOTE — Telephone Encounter (Signed)
[facility] requesting copy of records. I emailed records.

## 2021-04-18 ENCOUNTER — Encounter: Payer: Self-pay | Admitting: *Deleted

## 2021-05-20 ENCOUNTER — Other Ambulatory Visit (HOSPITAL_COMMUNITY): Payer: Self-pay | Admitting: Primary Care

## 2021-05-20 DIAGNOSIS — Z1231 Encounter for screening mammogram for malignant neoplasm of breast: Secondary | ICD-10-CM

## 2021-05-23 ENCOUNTER — Ambulatory Visit (HOSPITAL_COMMUNITY): Payer: BC Managed Care – PPO

## 2021-06-21 ENCOUNTER — Ambulatory Visit (HOSPITAL_COMMUNITY)
Admission: RE | Admit: 2021-06-21 | Discharge: 2021-06-21 | Disposition: A | Discharge status: Home / Self Care | Payer: BC Managed Care – PPO | Source: Ambulatory Visit | Attending: Primary Care | Admitting: Primary Care

## 2021-06-21 ENCOUNTER — Other Ambulatory Visit: Payer: Self-pay

## 2021-06-21 DIAGNOSIS — Z1231 Encounter for screening mammogram for malignant neoplasm of breast: Secondary | ICD-10-CM | POA: Diagnosis not present
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
## 2021-07-17 ENCOUNTER — Ambulatory Visit: Payer: BC Managed Care – PPO | Admitting: Cardiovascular Disease

## 2021-07-29 ENCOUNTER — Encounter (HOSPITAL_COMMUNITY): Payer: Self-pay

## 2021-07-29 ENCOUNTER — Emergency Department (HOSPITAL_COMMUNITY): Payer: BC Managed Care – PPO

## 2021-07-29 ENCOUNTER — Other Ambulatory Visit: Payer: Self-pay

## 2021-07-29 ENCOUNTER — Emergency Department (HOSPITAL_COMMUNITY)
Admission: EM | Admit: 2021-07-29 | Discharge: 2021-07-29 | Disposition: A | Discharge status: Home / Self Care | Payer: BC Managed Care – PPO | Attending: Emergency Medicine | Admitting: Emergency Medicine

## 2021-07-29 DIAGNOSIS — R001 Bradycardia, unspecified: Secondary | ICD-10-CM | POA: Insufficient documentation

## 2021-07-29 DIAGNOSIS — R110 Nausea: Secondary | ICD-10-CM | POA: Diagnosis not present

## 2021-07-29 DIAGNOSIS — R197 Diarrhea, unspecified: Secondary | ICD-10-CM | POA: Insufficient documentation

## 2021-07-29 DIAGNOSIS — R7303 Prediabetes: Secondary | ICD-10-CM | POA: Diagnosis not present

## 2021-07-29 DIAGNOSIS — R079 Chest pain, unspecified: Secondary | ICD-10-CM | POA: Insufficient documentation

## 2021-07-29 DIAGNOSIS — Z7984 Long term (current) use of oral hypoglycemic drugs: Secondary | ICD-10-CM | POA: Diagnosis not present

## 2021-07-29 DIAGNOSIS — R0789 Other chest pain: Secondary | ICD-10-CM | POA: Diagnosis not present

## 2021-07-29 LAB — TROPONIN I (HIGH SENSITIVITY)
Troponin I (High Sensitivity): 2 ng/L (ref <18)
Troponin I (High Sensitivity): 2 ng/L (ref <18)

## 2021-07-29 LAB — CBC
HCT: 44.2 % (ref 36.0–46.0)
Hemoglobin: 14.3 g/dL (ref 12.0–15.0)
MCH: 29.2 pg (ref 26.0–34.0)
MCHC: 32.4 g/dL (ref 30.0–36.0)
MCV: 90.4 fL (ref 80.0–100.0)
Platelets: 404 10*3/uL — ABNORMAL HIGH (ref 150–400)
RBC: 4.89 MIL/uL (ref 3.87–5.11)
RDW: 15.7 % — ABNORMAL HIGH (ref 11.5–15.5)
WBC: 7.2 10*3/uL (ref 4.0–10.5)
nRBC: 0 % (ref 0.0–0.2)

## 2021-07-29 LAB — COMPREHENSIVE METABOLIC PANEL
ALT: 38 U/L (ref 0–44)
AST: 24 U/L (ref 15–41)
Albumin: 4 g/dL (ref 3.5–5.0)
Alkaline Phosphatase: 88 U/L (ref 38–126)
Anion gap: 9 (ref 5–15)
BUN: 20 mg/dL (ref 6–20)
CO2: 25 mmol/L (ref 22–32)
Calcium: 9.6 mg/dL (ref 8.9–10.3)
Chloride: 106 mmol/L (ref 98–111)
Creatinine, Ser: 1.11 mg/dL — ABNORMAL HIGH (ref 0.44–1.00)
GFR, Estimated: >60 mL/min (ref >60)
Glucose, Bld: 133 mg/dL — ABNORMAL HIGH (ref 70–99)
Potassium: 4.1 mmol/L (ref 3.5–5.1)
Sodium: 140 mmol/L (ref 135–145)
Total Bilirubin: 0.7 mg/dL (ref 0.3–1.2)
Total Protein: 7.5 g/dL (ref 6.5–8.1)

## 2021-07-29 MED ORDER — ONDANSETRON 4 MG PO TBDP: 4.0000 mg | ORAL_TABLET | Freq: Three times a day (TID) | ORAL | 0 refills | Status: AC | PRN

## 2021-07-29 NOTE — ED Triage Notes (Signed)
Patient states intermittent CP with nausea and some blurred vision for one week. Denies SHOB.
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
## 2021-07-29 NOTE — Discharge Instructions (Addendum)
We willYour heart work-up was reassuring today.  Given some nausea medicine.  Your heart rate did go slow.  You seem to be asymptomatic with it but if you are have more lightheadedness and dizziness you may need to follow-up with cardiology again.  Follow-up with your doctor

## 2021-07-31 ENCOUNTER — Ambulatory Visit: Payer: BC Managed Care – PPO | Admitting: Internal Medicine

## 2021-07-31 ENCOUNTER — Telehealth: Payer: Self-pay | Admitting: *Deleted

## 2021-07-31 ENCOUNTER — Encounter: Payer: Self-pay | Admitting: Internal Medicine

## 2021-07-31 ENCOUNTER — Other Ambulatory Visit: Payer: Self-pay

## 2021-07-31 ENCOUNTER — Encounter: Payer: Self-pay | Admitting: *Deleted

## 2021-07-31 VITALS — BP 126/82 | HR 54 | Temp 97.5°F | Ht 63.0 in | Wt 196.8 lb

## 2021-07-31 DIAGNOSIS — R1013 Epigastric pain: Secondary | ICD-10-CM

## 2021-07-31 DIAGNOSIS — D123 Benign neoplasm of transverse colon: Secondary | ICD-10-CM | POA: Diagnosis not present

## 2021-07-31 DIAGNOSIS — R112 Nausea with vomiting, unspecified: Secondary | ICD-10-CM | POA: Diagnosis not present

## 2021-07-31 DIAGNOSIS — G8929 Other chronic pain: Secondary | ICD-10-CM | POA: Diagnosis not present

## 2021-07-31 MED ORDER — PROMETHAZINE HCL 12.5 MG PO TABS
12.5000 mg | ORAL_TABLET | Freq: Three times a day (TID) | ORAL | 1 refills | Status: DC | PRN
Start: 2021-07-31 — End: 2022-01-06
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
## 2021-07-31 NOTE — Telephone Encounter (Signed)
Per [employer] "The following solutions for the service date entered do not require Pre-Authorization by [employer]. Please note that benefit limits, if applicable, will still be applied. Contact the health plan using the number on the back of the member's ID card if you have any questions regarding coverage or Pre-Authorization requirements"
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
## 2021-07-31 NOTE — Patient Instructions (Signed)
We will schedule you for upper endoscopy to further evaluate your nausea vomiting as well as abdominal pain.  I will send in prescription for as needed Phenergan to [facility] in [place].  Further recommendations to follow.  It was nice seeing you again today.  Dr. Abbey Perry  At [facility] we value your feedback. You may receive a survey about your visit today. Please share your experience as we strive to create trusting relationships with our patients to provide genuine, compassionate, quality care.  We appreciate your understanding and patience as we review any laboratory studies, imaging, and other diagnostic tests that are ordered as we care for you. Our office policy is 5 business days for review of these results, and any emergent or urgent results are addressed in a timely manner for your best interest. If you do not hear from our office in 1 week, please contact us.   We also encourage the use of MyChart, which contains your medical information for your review as well. If you are not enrolled in this feature, an access code is on this after visit summary for your convenience. Thank you for allowing [place] to be involved in your care.  It was great to see you today!  I hope you have a great rest of your Winter!    Crystal Perry. Crystal Perry, D.O. Gastroenterology and Hepatology [facility]

## 2021-08-01 ENCOUNTER — Telehealth: Payer: Self-pay | Admitting: *Deleted

## 2021-08-01 NOTE — Telephone Encounter (Signed)
Spoke to [employer] in Endo.  She informed me to move pt's procedure time up to 2:00 with arrival at 12:30.  Called pt and made her aware.  She agreed to it.  Informed her to cut off liquids 4 hours prior to arrival.  She voiced understanding.

## 2021-08-02 ENCOUNTER — Ambulatory Visit (HOSPITAL_COMMUNITY): Payer: BC Managed Care – PPO | Admitting: Anesthesiology

## 2021-08-02 ENCOUNTER — Other Ambulatory Visit: Payer: Self-pay

## 2021-08-02 ENCOUNTER — Encounter (HOSPITAL_COMMUNITY): Payer: Self-pay

## 2021-08-02 ENCOUNTER — Encounter (HOSPITAL_COMMUNITY)
Admission: RE | Disposition: A | Discharge status: Home / Self Care | Payer: Self-pay | Source: Home / Self Care | Attending: Internal Medicine

## 2021-08-02 ENCOUNTER — Ambulatory Visit (HOSPITAL_COMMUNITY)
Admission: RE | Admit: 2021-08-02 | Discharge: 2021-08-02 | Disposition: A | Discharge status: Home / Self Care | Payer: BC Managed Care – PPO | Attending: Internal Medicine | Admitting: Internal Medicine

## 2021-08-02 DIAGNOSIS — R112 Nausea with vomiting, unspecified: Secondary | ICD-10-CM | POA: Diagnosis not present

## 2021-08-02 DIAGNOSIS — K2970 Gastritis, unspecified, without bleeding: Secondary | ICD-10-CM | POA: Diagnosis not present

## 2021-08-02 DIAGNOSIS — Z7982 Long term (current) use of aspirin: Secondary | ICD-10-CM | POA: Diagnosis not present

## 2021-08-02 DIAGNOSIS — K2950 Unspecified chronic gastritis without bleeding: Secondary | ICD-10-CM | POA: Insufficient documentation

## 2021-08-02 DIAGNOSIS — Z9884 Bariatric surgery status: Secondary | ICD-10-CM | POA: Diagnosis not present

## 2021-08-02 DIAGNOSIS — Z79899 Other long term (current) drug therapy: Secondary | ICD-10-CM | POA: Insufficient documentation

## 2021-08-02 DIAGNOSIS — Z7985 Long-term (current) use of injectable non-insulin antidiabetic drugs: Secondary | ICD-10-CM | POA: Diagnosis not present

## 2021-08-02 DIAGNOSIS — Z791 Long term (current) use of non-steroidal anti-inflammatories (NSAID): Secondary | ICD-10-CM | POA: Insufficient documentation

## 2021-08-02 DIAGNOSIS — Z87891 Personal history of nicotine dependence: Secondary | ICD-10-CM | POA: Diagnosis not present

## 2021-08-02 DIAGNOSIS — Z7984 Long term (current) use of oral hypoglycemic drugs: Secondary | ICD-10-CM | POA: Insufficient documentation

## 2021-08-02 DIAGNOSIS — R1013 Epigastric pain: Secondary | ICD-10-CM | POA: Insufficient documentation

## 2021-08-02 DIAGNOSIS — R7303 Prediabetes: Secondary | ICD-10-CM | POA: Insufficient documentation

## 2021-08-02 DIAGNOSIS — K319 Disease of stomach and duodenum, unspecified: Secondary | ICD-10-CM | POA: Insufficient documentation

## 2021-08-02 HISTORY — PX: BIOPSY: SHX5522

## 2021-08-02 HISTORY — PX: ESOPHAGOGASTRODUODENOSCOPY (EGD) WITH PROPOFOL: SHX5813

## 2021-08-02 LAB — GLUCOSE, CAPILLARY: Glucose-Capillary: 75 mg/dL (ref 70–99)

## 2021-08-02 SURGERY — ESOPHAGOGASTRODUODENOSCOPY (EGD) WITH PROPOFOL
Anesthesia: General

## 2021-08-02 MED ORDER — PROPOFOL 10 MG/ML IV BOLUS
INTRAVENOUS | Status: DC | PRN
  Administered 2021-08-02: 100 mg via INTRAVENOUS

## 2021-08-02 MED ORDER — LACTATED RINGERS IV SOLN: INTRAVENOUS | Status: DC

## 2021-08-02 MED ORDER — LIDOCAINE HCL (CARDIAC) PF 100 MG/5ML IV SOSY
PREFILLED_SYRINGE | INTRAVENOUS | Status: DC | PRN
Start: 2021-08-02 — End: 2021-08-02
  Administered 2021-08-02: 50 mg via INTRAVENOUS

## 2021-08-02 MED ORDER — PANTOPRAZOLE SODIUM 40 MG PO TBEC: 40.0000 mg | DELAYED_RELEASE_TABLET | Freq: Every day | ORAL | 11 refills | Status: AC

## 2021-08-02 NOTE — Anesthesia Preprocedure Evaluation (Signed)
Anesthesia Evaluation  Patient identified by MRN, date of birth, ID band Patient awake    Reviewed: Allergy & Precautions, NPO status , Patient's Chart, lab work & pertinent test results  Airway Mallampati: III  TM Distance: >3 FB Neck ROM: Full    Dental  (+) Dental Advisory Given No notable dental injury:   Pulmonary neg pulmonary ROS, former smoker,    Pulmonary exam normal breath sounds clear to auscultation       Cardiovascular negative cardio ROS Normal cardiovascular exam Rhythm:Regular Rate:Normal     Neuro/Psych  Headaches,  Neuromuscular disease negative psych ROS   GI/Hepatic negative GI ROS, Neg liver ROS,   Endo/Other  negative endocrine ROS  Renal/GU negative Renal ROS  negative genitourinary   Musculoskeletal negative musculoskeletal ROS (+)   Abdominal   Peds negative pediatric ROS (+)  Hematology negative hematology ROS (+)   Anesthesia Other Findings   Reproductive/Obstetrics negative OB ROS                             Anesthesia Physical Anesthesia Plan  ASA: 2  Anesthesia Plan: General   Post-op Pain Management: Minimal or no pain anticipated   Induction: Intravenous  PONV Risk Score and Plan: TIVA  Airway Management Planned: Nasal Cannula and Natural Airway  Additional Equipment:   Intra-op Plan:   Post-operative Plan:   Informed Consent: I have reviewed the patients History and Physical, chart, labs and discussed the procedure including the risks, benefits and alternatives for the proposed anesthesia with the patient or authorized representative who has indicated his/her understanding and acceptance.     Dental advisory given  Plan Discussed with: CRNA and Surgeon  Anesthesia Plan Comments:         Anesthesia Quick Evaluation

## 2021-08-02 NOTE — Interval H&P Note (Signed)
History and Physical Interval Note:  08/02/2021 1:11 PM  Crystal Perry  has presented today for surgery, with the diagnosis of epigastric pain, nausea, vomiting.  The various methods of treatment have been discussed with the patient and family. After consideration of risks, benefits and other options for treatment, the patient has consented to  Procedure(s) with comments: ESOPHAGOGASTRODUODENOSCOPY (EGD) WITH PROPOFOL (N/A) - 2:45pm BIOPSY as a surgical intervention.  The patient's history has been reviewed, patient examined, no change in status, stable for surgery.  I have reviewed the patient's chart and labs.  Questions were answered to the patient's satisfaction.     [name]

## 2021-08-02 NOTE — Transfer of Care (Signed)
Immediate Anesthesia Transfer of Care Note  Patient: Crystal Perry  Procedure(s) Performed: ESOPHAGOGASTRODUODENOSCOPY (EGD) WITH PROPOFOL BIOPSY  Patient Location: Endoscopy Unit  Anesthesia Type:General  Level of Consciousness: awake  Airway & Oxygen Therapy: Patient Spontanous Breathing  Post-op Assessment: Report given to RN and Post -op Vital signs reviewed and stable  Post vital signs: Reviewed and stable  Last Vitals:  Vitals Value Taken Time  BP    Temp    Pulse    Resp    SpO2      Last Pain:  Vitals:   08/02/21 1251  TempSrc:   PainSc: 0-No pain      Patients Stated Pain Goal: 10 (30/73/54 3014)  Complications: No notable events documented.

## 2021-08-02 NOTE — Anesthesia Postprocedure Evaluation (Signed)
Anesthesia Post Note  Patient: Crystal Perry  Procedure(s) Performed: ESOPHAGOGASTRODUODENOSCOPY (EGD) WITH PROPOFOL BIOPSY  Patient location during evaluation: Endoscopy Anesthesia Type: General Level of consciousness: awake and alert and oriented Pain management: pain level controlled Vital Signs Assessment: post-procedure vital signs reviewed and stable Respiratory status: spontaneous breathing, nonlabored ventilation and respiratory function stable Cardiovascular status: blood pressure returned to baseline and stable Postop Assessment: no apparent nausea or vomiting Anesthetic complications: no   No notable events documented.   Last Vitals:  Vitals:   08/02/21 1220 08/02/21 1302  BP: 119/74 99/62  Pulse: (!) 52   Resp: 19 16  Temp: 36.7 C 36.9 C  SpO2: 99% 98%    Last Pain:  Vitals:   08/02/21 1302  TempSrc: Oral  PainSc: 0-No pain                 Crystal Perry C Haruna Rohlfs

## 2021-08-02 NOTE — Discharge Instructions (Addendum)
EGD Discharge instructions Please read the instructions outlined below and refer to this sheet in the next few weeks. These discharge instructions provide you with general information on caring for yourself after you leave the hospital. Your doctor may also give you specific instructions. While your treatment has been planned according to the most current medical practices available, unavoidable complications occasionally occur. If you have any problems or questions after discharge, please call your doctor. ACTIVITY You may resume your regular activity but move at a slower pace for the next 24 hours.  Take frequent rest periods for the next 24 hours.  Walking will help expel (get rid of) the air and reduce the bloated feeling in your abdomen.  No driving for 24 hours (because of the anesthesia (medicine) used during the test).  You may shower.  Do not sign any important legal documents or operate any machinery for 24 hours (because of the anesthesia used during the test).  NUTRITION Drink plenty of fluids.  You may resume your normal diet.  Begin with a light meal and progress to your normal diet.  Avoid alcoholic beverages for 24 hours or as instructed by your caregiver.  MEDICATIONS You may resume your normal medications unless your caregiver tells you otherwise.  WHAT YOU CAN EXPECT TODAY You may experience abdominal discomfort such as a feeling of fullness or gas pains.  FOLLOW-UP Your doctor will discuss the results of your test with you.  SEEK IMMEDIATE MEDICAL ATTENTION IF ANY OF THE FOLLOWING OCCUR: Excessive nausea (feeling sick to your stomach) and/or vomiting.  Severe abdominal pain and distention (swelling).  Trouble swallowing.  Temperature over 101 F (37.8 C).  Rectal bleeding or vomiting of blood.    Your EGD revealed mild amount inflammation in your stomach.  I took biopsies of this to rule out infection with a bacteria called H. pylori.  Await pathology results, my  office will contact you.  I am going to start you on a new medication called pantoprazole 40 mg daily.  Continue promethazine as needed for nausea.  Follow-up with GI in 3 to 4 months.   CALL OFFICE Monday TO SCHEDULE FOLLOW-UP APPOINTMENT   I hope you have a great rest of your week!  Elon Alas. Abbey Chatters, D.O. Gastroenterology and Hepatology [facility]

## 2021-08-02 NOTE — Op Note (Signed)
[facility] Patient Name: Crystal Perry Procedure Date: 08/02/2021 12:45 PM MRN: [id number] Date of Birth: 31-Oct-1970 Attending MD: Elon Alas. Abbey Chatters DO CSN: [id number] Age: 51 Admit Type: Outpatient Procedure:                Upper GI endoscopy Indications:              Epigastric abdominal pain, Nausea with vomiting Providers:                Elon Alas. Abbey Chatters, DO, Lambert Mody, Aram Candela Referring MD:              Medicines:                See the Anesthesia note for documentation of the                            administered medications Complications:            No immediate complications. Estimated Blood Loss:     Estimated blood loss was minimal. Procedure:                Pre-Anesthesia Assessment:                           - The anesthesia plan was to use monitored                            anesthesia care (MAC).                           After obtaining informed consent, the endoscope was                            passed under direct vision. Throughout the                            procedure, the patient's blood pressure, pulse, and                            oxygen saturations were monitored continuously. The                            [id number]-H190 ([id number]) scope was introduced through the                            mouth, and advanced to the second part of duodenum.                            The upper GI endoscopy was accomplished without                            difficulty. The patient tolerated the procedure                            well. Scope In: 12:54:44  PM Scope Out: 12:57:33 PM Total Procedure Duration: 0 hours 2 minutes 49 seconds  Findings:      There is no endoscopic evidence of bleeding, areas of erosion,       esophagitis, ulcerations or varices in the entire esophagus.      Evidence of a sleeve gastrectomy was found in the stomach.      Patchy mild inflammation characterized by erythema was found in the        gastric body. Biopsies were taken with a cold forceps for Helicobacter       pylori testing.      The duodenal bulb, first portion of the duodenum and second portion of       the duodenum were normal. Impression:               - A sleeve gastrectomy was found.                           - Gastritis. Biopsied.                           - Normal duodenal bulb, first portion of the                            duodenum and second portion of the duodenum. Moderate Sedation:      Per Anesthesia Care Recommendation:           - Patient has a contact number available for                            emergencies. The signs and symptoms of potential                            delayed complications were discussed with the                            patient. Return to normal activities tomorrow.                            Written discharge instructions were provided to the                            patient.                           - Resume previous diet.                           - Continue present medications.                           - Await pathology results.                           - Use Protonix (pantoprazole) 40 mg PO daily.                           - Return to GI clinic in 4 months. Procedure Code(s):        ---  Professional ---                           [phone], Esophagogastroduodenoscopy, flexible,                            transoral; with biopsy, single or multiple Diagnosis Code(s):        --- Professional ---                           Z98.84, Bariatric surgery status                           K29.70, Gastritis, unspecified, without bleeding                           R10.13, Epigastric pain                           R11.2, Nausea with vomiting, unspecified CPT copyright 2019 American Medical Association. All rights reserved. The codes documented in this report are preliminary and upon coder review may  be revised to meet current compliance requirements. Elon Alas. Abbey Chatters, DO [employer] Abbey Chatters, DO 08/02/2021 1:03:59 PM This report has been signed electronically. Number of Addenda: 0

## 2021-08-06 LAB — SURGICAL PATHOLOGY

## 2021-08-08 ENCOUNTER — Encounter (HOSPITAL_COMMUNITY): Payer: Self-pay | Admitting: Internal Medicine

## 2021-08-27 DIAGNOSIS — H40023 Open angle with borderline findings, high risk, bilateral: Secondary | ICD-10-CM | POA: Diagnosis not present

## 2021-09-03 DIAGNOSIS — Z23 Encounter for immunization: Secondary | ICD-10-CM | POA: Diagnosis not present
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
## 2021-09-16 ENCOUNTER — Ambulatory Visit: Payer: BC Managed Care – PPO | Admitting: Cardiovascular Disease

## 2021-09-18 ENCOUNTER — Encounter: Payer: Self-pay | Admitting: Cardiovascular Disease

## 2021-10-07 DIAGNOSIS — R001 Bradycardia, unspecified: Secondary | ICD-10-CM | POA: Diagnosis not present

## 2021-10-07 DIAGNOSIS — R519 Headache, unspecified: Secondary | ICD-10-CM | POA: Diagnosis not present

## 2021-10-07 DIAGNOSIS — I10 Essential (primary) hypertension: Secondary | ICD-10-CM | POA: Diagnosis not present

## 2021-10-07 DIAGNOSIS — R0600 Dyspnea, unspecified: Secondary | ICD-10-CM | POA: Diagnosis not present

## 2021-10-07 DIAGNOSIS — R42 Dizziness and giddiness: Secondary | ICD-10-CM | POA: Diagnosis not present

## 2021-10-08 DIAGNOSIS — Z6832 Body mass index (BMI) 32.0-32.9, adult: Secondary | ICD-10-CM | POA: Diagnosis not present

## 2021-10-08 DIAGNOSIS — I10 Essential (primary) hypertension: Secondary | ICD-10-CM | POA: Diagnosis not present

## 2021-10-24 DIAGNOSIS — E6609 Other obesity due to excess calories: Secondary | ICD-10-CM | POA: Diagnosis not present

## 2021-10-24 DIAGNOSIS — Z6835 Body mass index (BMI) 35.0-35.9, adult: Secondary | ICD-10-CM | POA: Diagnosis not present

## 2021-10-24 DIAGNOSIS — Z13220 Encounter for screening for lipoid disorders: Secondary | ICD-10-CM | POA: Diagnosis not present

## 2021-10-24 DIAGNOSIS — I10 Essential (primary) hypertension: Secondary | ICD-10-CM | POA: Diagnosis not present

## 2021-11-07 DIAGNOSIS — Z113 Encounter for screening for infections with a predominantly sexual mode of transmission: Secondary | ICD-10-CM | POA: Diagnosis not present

## 2021-11-07 DIAGNOSIS — N760 Acute vaginitis: Secondary | ICD-10-CM | POA: Diagnosis not present

## 2021-11-07 DIAGNOSIS — Z114 Encounter for screening for human immunodeficiency virus [HIV]: Secondary | ICD-10-CM | POA: Diagnosis not present

## 2021-12-03 ENCOUNTER — Encounter: Payer: Self-pay | Admitting: Internal Medicine

## 2021-12-09 ENCOUNTER — Ambulatory Visit: Payer: BC Managed Care – PPO | Admitting: Cardiovascular Disease

## 2021-12-25 DIAGNOSIS — Z7982 Long term (current) use of aspirin: Secondary | ICD-10-CM | POA: Diagnosis not present

## 2021-12-25 DIAGNOSIS — R519 Headache, unspecified: Secondary | ICD-10-CM | POA: Diagnosis not present

## 2021-12-25 DIAGNOSIS — E119 Type 2 diabetes mellitus without complications: Secondary | ICD-10-CM | POA: Diagnosis not present

## 2021-12-25 DIAGNOSIS — R059 Cough, unspecified: Secondary | ICD-10-CM | POA: Diagnosis not present

## 2021-12-25 DIAGNOSIS — U071 COVID-19: Secondary | ICD-10-CM | POA: Diagnosis not present

## 2021-12-25 DIAGNOSIS — J9811 Atelectasis: Secondary | ICD-10-CM | POA: Diagnosis not present

## 2021-12-25 DIAGNOSIS — Z79899 Other long term (current) drug therapy: Secondary | ICD-10-CM | POA: Diagnosis not present
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
## 2022-01-06 ENCOUNTER — Encounter: Payer: Self-pay | Admitting: Physician Assistant

## 2022-01-06 ENCOUNTER — Ambulatory Visit (INDEPENDENT_AMBULATORY_CARE_PROVIDER_SITE_OTHER): Payer: BC Managed Care – PPO | Admitting: Physician Assistant

## 2022-01-06 VITALS — BP 128/64 | HR 94 | Ht 63.0 in | Wt 200.6 lb

## 2022-01-06 DIAGNOSIS — E782 Mixed hyperlipidemia: Secondary | ICD-10-CM

## 2022-01-06 DIAGNOSIS — R42 Dizziness and giddiness: Secondary | ICD-10-CM

## 2022-01-06 DIAGNOSIS — M549 Dorsalgia, unspecified: Secondary | ICD-10-CM | POA: Diagnosis not present

## 2022-01-06 DIAGNOSIS — I10 Essential (primary) hypertension: Secondary | ICD-10-CM | POA: Diagnosis not present

## 2022-01-06 DIAGNOSIS — E109 Type 1 diabetes mellitus without complications: Secondary | ICD-10-CM | POA: Diagnosis not present

## 2022-01-06 NOTE — Patient Instructions (Signed)
Medication Instructions:  Your physician recommends that you continue on your current medications as directed. Please refer to the Current Medication list given to you today.  *If you need a refill on your cardiac medications before your next appointment, please call your pharmacy*  Lab Work: Fasting Lipids  If you have labs (blood work) drawn today and your tests are completely normal, you will receive your results only by: [place] (if you have MyChart) OR A paper copy in the mail If you have any lab test that is abnormal or we need to change your treatment, we will call you to review the results.  Follow-Up: At [facility], you and your health needs are our priority.  As part of our continuing mission to provide you with exceptional heart care, we have created designated Provider Care Teams.  These Care Teams include your primary Cardiologist (physician) and Advanced Practice Providers (APPs -  Physician Assistants and Nurse Practitioners) who all work together to provide you with the care you need, when you need it.  Your next appointment:   1 year(s)  The format for your next appointment:   In Person  Provider:   Rozann Lesches, MD or Nicholes Rough, PA-C   Other Instructions: Purchase an Omron blood pressure cuff and keep up with your readings at home.   Important Information About Sugar

## 2022-01-08 DIAGNOSIS — I10 Essential (primary) hypertension: Secondary | ICD-10-CM | POA: Diagnosis not present

## 2022-01-08 DIAGNOSIS — Z6833 Body mass index (BMI) 33.0-33.9, adult: Secondary | ICD-10-CM | POA: Diagnosis not present

## 2022-03-11 DIAGNOSIS — Z131 Encounter for screening for diabetes mellitus: Secondary | ICD-10-CM | POA: Diagnosis not present

## 2022-03-25 ENCOUNTER — Ambulatory Visit: Payer: BC Managed Care – PPO | Admitting: Physician Assistant

## 2022-04-20 DIAGNOSIS — M5442 Lumbago with sciatica, left side: Secondary | ICD-10-CM | POA: Diagnosis not present

## 2022-04-20 DIAGNOSIS — Z7982 Long term (current) use of aspirin: Secondary | ICD-10-CM | POA: Diagnosis not present

## 2022-04-20 DIAGNOSIS — Z882 Allergy status to sulfonamides status: Secondary | ICD-10-CM | POA: Diagnosis not present

## 2022-04-20 DIAGNOSIS — M549 Dorsalgia, unspecified: Secondary | ICD-10-CM | POA: Diagnosis not present

## 2022-04-20 DIAGNOSIS — E119 Type 2 diabetes mellitus without complications: Secondary | ICD-10-CM | POA: Diagnosis not present

## 2022-04-20 DIAGNOSIS — M5432 Sciatica, left side: Secondary | ICD-10-CM | POA: Diagnosis not present

## 2022-04-20 DIAGNOSIS — Z79899 Other long term (current) drug therapy: Secondary | ICD-10-CM | POA: Diagnosis not present

## 2022-04-21 DIAGNOSIS — Z6833 Body mass index (BMI) 33.0-33.9, adult: Secondary | ICD-10-CM | POA: Diagnosis not present

## 2022-04-21 DIAGNOSIS — Z0000 Encounter for general adult medical examination without abnormal findings: Secondary | ICD-10-CM | POA: Diagnosis not present

## 2022-04-22 DIAGNOSIS — M4316 Spondylolisthesis, lumbar region: Secondary | ICD-10-CM | POA: Diagnosis not present

## 2022-04-22 DIAGNOSIS — M47814 Spondylosis without myelopathy or radiculopathy, thoracic region: Secondary | ICD-10-CM | POA: Diagnosis not present

## 2022-04-22 DIAGNOSIS — M4186 Other forms of scoliosis, lumbar region: Secondary | ICD-10-CM | POA: Diagnosis not present

## 2022-04-22 DIAGNOSIS — M4317 Spondylolisthesis, lumbosacral region: Secondary | ICD-10-CM | POA: Diagnosis not present

## 2022-04-22 DIAGNOSIS — R52 Pain, unspecified: Secondary | ICD-10-CM | POA: Diagnosis not present

## 2022-04-22 DIAGNOSIS — M47812 Spondylosis without myelopathy or radiculopathy, cervical region: Secondary | ICD-10-CM | POA: Diagnosis not present

## 2022-04-22 DIAGNOSIS — M47816 Spondylosis without myelopathy or radiculopathy, lumbar region: Secondary | ICD-10-CM | POA: Diagnosis not present

## 2022-04-22 DIAGNOSIS — Z7982 Long term (current) use of aspirin: Secondary | ICD-10-CM | POA: Diagnosis not present

## 2022-04-22 DIAGNOSIS — Z882 Allergy status to sulfonamides status: Secondary | ICD-10-CM | POA: Diagnosis not present

## 2022-04-22 DIAGNOSIS — M79606 Pain in leg, unspecified: Secondary | ICD-10-CM | POA: Diagnosis not present

## 2022-04-22 DIAGNOSIS — M5432 Sciatica, left side: Secondary | ICD-10-CM | POA: Diagnosis not present

## 2022-04-22 DIAGNOSIS — M549 Dorsalgia, unspecified: Secondary | ICD-10-CM | POA: Diagnosis not present

## 2022-04-22 DIAGNOSIS — M4126 Other idiopathic scoliosis, lumbar region: Secondary | ICD-10-CM | POA: Diagnosis not present

## 2022-04-22 DIAGNOSIS — M5137 Other intervertebral disc degeneration, lumbosacral region: Secondary | ICD-10-CM | POA: Diagnosis not present

## 2022-04-22 DIAGNOSIS — E119 Type 2 diabetes mellitus without complications: Secondary | ICD-10-CM | POA: Diagnosis not present

## 2022-05-06 DIAGNOSIS — M4317 Spondylolisthesis, lumbosacral region: Secondary | ICD-10-CM | POA: Diagnosis not present

## 2022-05-06 DIAGNOSIS — M5417 Radiculopathy, lumbosacral region: Secondary | ICD-10-CM | POA: Diagnosis not present

## 2022-05-09 ENCOUNTER — Other Ambulatory Visit: Payer: Self-pay | Admitting: Neurological Surgery

## 2022-05-12 ENCOUNTER — Encounter (HOSPITAL_COMMUNITY): Payer: Self-pay

## 2022-05-20 DIAGNOSIS — M4317 Spondylolisthesis, lumbosacral region: Secondary | ICD-10-CM | POA: Diagnosis not present

## 2022-05-20 NOTE — Pre-Procedure Instructions (Signed)
Surgical Instructions    Your procedure is scheduled on Friday, December 15th.  Report to [facility] Main Entrance "A" at 5:30 A.M., then check in with the Admitting office.  Call this number if you have problems the morning of surgery:  [phone]   If you have any questions prior to your surgery date call [phone]: Open Monday-Friday 8am-4pm    Remember:  Do not eat or drink after midnight the night before your surgery   Take these medicines the morning of surgery with A SIP OF WATER  gabapentin (NEURONTIN)   As of today, STOP taking any Aspirin (unless otherwise instructed by your surgeon) Aleve, Naproxen, Ibuprofen, Motrin, Advil, Goody's, BC's, all herbal medications, fish oil, and all vitamins.  WHAT DO I DO ABOUT MY DIABETES MEDICATION?  HOLD  Dulaglutide (TRULICITY) for 1 week prior to surgery.   HOW TO MANAGE YOUR DIABETES BEFORE AND AFTER SURGERY  Why is it important to control my blood sugar before and after surgery? Improving blood sugar levels before and after surgery helps healing and can limit problems. A way of improving blood sugar control is eating a healthy diet by:  Eating less sugar and carbohydrates  Increasing activity/exercise  Talking with your doctor about reaching your blood sugar goals High blood sugars (greater than 180 mg/dL) can raise your risk of infections and slow your recovery, so you will need to focus on controlling your diabetes during the weeks before surgery. Make sure that the doctor who takes care of your diabetes knows about your planned surgery including the date and location.  How do I manage my blood sugar before surgery? Check your blood sugar at least 4 times a day, starting 2 days before surgery, to make sure that the level is not too high or low.  Check your blood sugar the morning of your surgery when you wake up and every 2 hours until you get to the Short Stay unit.  If your blood sugar is less than 70 mg/dL, you will  need to treat for low blood sugar: Do not take insulin. Treat a low blood sugar (less than 70 mg/dL) with  cup of clear juice (cranberry or apple), 4 glucose tablets, OR glucose gel. Recheck blood sugar in 15 minutes after treatment (to make sure it is greater than 70 mg/dL). If your blood sugar is not greater than 70 mg/dL on recheck, call [phone] for further instructions. Report your blood sugar to the short stay nurse when you get to Short Stay.  If you are admitted to the hospital after surgery: Your blood sugar will be checked by the staff and you will probably be given insulin after surgery (instead of oral diabetes medicines) to make sure you have good blood sugar levels. The goal for blood sugar control after surgery is 80-180 mg/dL.                      Do NOT Smoke (Tobacco/Vaping) for 24 hours prior to your procedure.  If you use a CPAP at night, you may bring your mask/headgear for your overnight stay.   Contacts, glasses, piercing's, hearing aid's, dentures or partials may not be worn into surgery, please bring cases for these belongings.    For patients admitted to the hospital, discharge time will be determined by your treatment team.   Patients discharged the day of surgery will not be allowed to drive home, and someone needs to stay with them for 24 hours.  SURGICAL  WAITING ROOM VISITATION Patients having surgery or a procedure may have no more than 2 support people in the waiting area - these visitors may rotate.   Children under the age of 70 must have an adult with them who is not the patient. If the patient needs to stay at the hospital during part of their recovery, the visitor guidelines for inpatient rooms apply. Pre-op nurse will coordinate an appropriate time for 1 support person to accompany patient in pre-op.  This support person may not rotate.   Please refer to the [facility] website for the visitor guidelines for Inpatients (after your surgery is over  and you are in a regular room).    Special instructions:   [facility]- Preparing For Surgery  Before surgery, you can play an important role. Because skin is not sterile, your skin needs to be as free of germs as possible. You can reduce the number of germs on your skin by washing with CHG (chlorahexidine gluconate) Soap before surgery.  CHG is an antiseptic cleaner which kills germs and bonds with the skin to continue killing germs even after washing.    Oral Hygiene is also important to reduce your risk of infection.  Remember - BRUSH YOUR TEETH THE MORNING OF SURGERY WITH YOUR REGULAR TOOTHPASTE  Please do not use if you have an allergy to CHG or antibacterial soaps. If your skin becomes reddened/irritated stop using the CHG.  Do not shave (including legs and underarms) for at least 48 hours prior to first CHG shower. It is OK to shave your face.  Please follow these instructions carefully.   Shower the NIGHT BEFORE SURGERY and the MORNING OF SURGERY  If you chose to wash your hair, wash your hair first as usual with your normal shampoo.  After you shampoo, rinse your hair and body thoroughly to remove the shampoo.  Use CHG Soap as you would any other liquid soap. You can apply CHG directly to the skin and wash gently with a scrungie or a clean washcloth.   Apply the CHG Soap to your body ONLY FROM THE NECK DOWN.  Do not use on open wounds or open sores. Avoid contact with your eyes, ears, mouth and genitals (private parts). Wash Face and genitals (private parts)  with your normal soap.   Wash thoroughly, paying special attention to the area where your surgery will be performed.  Thoroughly rinse your body with warm water from the neck down.  DO NOT shower/wash with your normal soap after using and rinsing off the CHG Soap.  Pat yourself dry with a CLEAN TOWEL.  Wear CLEAN PAJAMAS to bed the night before surgery  Place CLEAN SHEETS on your bed the night before your  surgery  DO NOT SLEEP WITH PETS.   Day of Surgery: Take a shower with CHG soap. Do not wear jewelry or makeup Do not wear lotions, powders, perfumes, or deodorant. Do not shave 48 hours prior to surgery.  Do not bring valuables to the hospital.  [facility] is not responsible for any belongings or valuables. Do not wear nail polish, gel polish, artificial nails, or any other type of covering on natural nails (fingers and toes) If you have artificial nails or gel coating that need to be removed by a nail salon, please have this removed prior to surgery. Artificial nails or gel coating may interfere with anesthesia's ability to adequately monitor your vital signs. Wear Clean/Comfortable clothing the morning of surgery Remember to brush your  teeth WITH YOUR REGULAR TOOTHPASTE.   Please read over the following fact sheets that you were given.    If you received a COVID test during your pre-op visit  it is requested that you wear a mask when out in public, stay away from anyone that may not be feeling well and notify your surgeon if you develop symptoms. If you have been in contact with anyone that has tested positive in the last 10 days please notify you surgeon.

## 2022-05-21 ENCOUNTER — Encounter (HOSPITAL_COMMUNITY)
Admission: RE | Admit: 2022-05-21 | Discharge: 2022-05-21 | Disposition: A | Discharge status: Home / Self Care | Payer: BC Managed Care – PPO | Source: Ambulatory Visit | Attending: Neurological Surgery | Admitting: Neurological Surgery

## 2022-05-21 ENCOUNTER — Other Ambulatory Visit: Payer: Self-pay

## 2022-05-21 ENCOUNTER — Inpatient Hospital Stay (HOSPITAL_COMMUNITY): Admission: RE | Admit: 2022-05-21 | Payer: BC Managed Care – PPO | Source: Ambulatory Visit

## 2022-05-21 ENCOUNTER — Encounter (HOSPITAL_COMMUNITY): Payer: Self-pay

## 2022-05-21 VITALS — BP 125/85 | HR 76 | Temp 97.9°F | Resp 18 | Ht 63.0 in | Wt 196.9 lb

## 2022-05-21 DIAGNOSIS — E119 Type 2 diabetes mellitus without complications: Secondary | ICD-10-CM | POA: Diagnosis not present

## 2022-05-21 DIAGNOSIS — Z01812 Encounter for preprocedural laboratory examination: Secondary | ICD-10-CM | POA: Insufficient documentation

## 2022-05-21 DIAGNOSIS — Z01818 Encounter for other preprocedural examination: Secondary | ICD-10-CM

## 2022-05-21 HISTORY — DX: Type 2 diabetes mellitus without complications: E11.9

## 2022-05-21 HISTORY — DX: Essential (primary) hypertension: I10

## 2022-05-21 LAB — BASIC METABOLIC PANEL
Anion gap: 10 (ref 5–15)
BUN: 12 mg/dL (ref 6–20)
CO2: 23 mmol/L (ref 22–32)
Calcium: 9.4 mg/dL (ref 8.9–10.3)
Chloride: 105 mmol/L (ref 98–111)
Creatinine, Ser: 1.28 mg/dL — ABNORMAL HIGH (ref 0.44–1.00)
GFR, Estimated: 50 mL/min — ABNORMAL LOW (ref >60)
Glucose, Bld: 105 mg/dL — ABNORMAL HIGH (ref 70–99)
Potassium: 4.8 mmol/L (ref 3.5–5.1)
Sodium: 138 mmol/L (ref 135–145)

## 2022-05-21 LAB — SURGICAL PCR SCREEN
MRSA, PCR: NEGATIVE
Staphylococcus aureus: NEGATIVE

## 2022-05-21 LAB — CBC
HCT: 40.5 % (ref 36.0–46.0)
Hemoglobin: 13.1 g/dL (ref 12.0–15.0)
MCH: 28.4 pg (ref 26.0–34.0)
MCHC: 32.3 g/dL (ref 30.0–36.0)
MCV: 87.7 fL (ref 80.0–100.0)
Platelets: 387 10*3/uL (ref 150–400)
RBC: 4.62 MIL/uL (ref 3.87–5.11)
RDW: 14.4 % (ref 11.5–15.5)
WBC: 5.9 10*3/uL (ref 4.0–10.5)
nRBC: 0 % (ref 0.0–0.2)

## 2022-05-21 LAB — TYPE AND SCREEN
ABO/RH(D): A POS
Antibody Screen: NEGATIVE

## 2022-05-21 LAB — HEMOGLOBIN A1C
Hgb A1c MFr Bld: 7 % — ABNORMAL HIGH (ref 4.8–5.6)
Mean Plasma Glucose: 154 mg/dL

## 2022-05-21 LAB — PROTIME-INR
INR: 1 (ref 0.8–1.2)
Prothrombin Time: 12.9 seconds (ref 11.4–15.2)

## 2022-05-21 LAB — GLUCOSE, CAPILLARY: Glucose-Capillary: 105 mg/dL — ABNORMAL HIGH (ref 70–99)

## 2022-05-21 NOTE — Progress Notes (Signed)
PCP - Eulogio Ditch, NP Cardiologist - Dr. Rozann Lesches  PPM/ICD - Denies Device Orders - n/a Rep Notified - n/a  Chest x-ray - 07/29/2021 EKG - 07/30/2021 Stress Test - 07/11/2020 ECHO - 09/22/2013 Cardiac Cath - Denies  Sleep Study - Denies CPAP - n/a  Pt is DM2. She does not own a glucose meter and therefore does not check her blood sugars at home. She is not aware of a normal fasting CBG range. Her CBG at PAT appointment was 109. This morning she has had water and a bagel with cream cheese.  Last dose of GLP1 agonist- Last dose of Trulicity was 40/37/5436. Crystal Caldwell, PA-C made aware GLP1 instructions: Do not take any additional dose of medication prior to surgery  Blood Thinner Instructions: n/a Aspirin Instructions: n/a  NPO after midnight  COVID TEST- n/a   Anesthesia review: No.   Patient denies shortness of breath, fever, cough and chest pain at PAT appointment   All instructions explained to the patient, with a verbal understanding of the material. Patient agrees to go over the instructions while at home for a better understanding. Patient also instructed to self quarantine after being tested for COVID-19. The opportunity to ask questions was provided.

## 2022-05-22 NOTE — Anesthesia Preprocedure Evaluation (Addendum)
Anesthesia Evaluation  Patient identified by MRN, date of birth, ID band Patient awake    Reviewed: Allergy & Precautions, NPO status , Patient's Chart, lab work & pertinent test results  History of Anesthesia Complications Negative for: history of anesthetic complications  Airway Mallampati: II  TM Distance: >3 FB Neck ROM: Full    Dental  (+) Teeth Intact, Dental Advisory Given   Pulmonary former smoker   Pulmonary exam normal breath sounds clear to auscultation       Cardiovascular hypertension, Pt. on medications Normal cardiovascular exam Rhythm:Regular Rate:Normal     Neuro/Psych  Headaches Spondylolisthesis    GI/Hepatic Neg liver ROS,GERD  Medicated,,  Endo/Other  diabetes (Trulicity), Type 2  Obesity   Renal/GU negative Renal ROS     Musculoskeletal negative musculoskeletal ROS (+)    Abdominal   Peds  Hematology negative hematology ROS (+)   Anesthesia Other Findings   Reproductive/Obstetrics                             Anesthesia Physical Anesthesia Plan  ASA: 2  Anesthesia Plan: General   Post-op Pain Management: Tylenol PO (pre-op)*   Induction: Intravenous  PONV Risk Score and Plan: 3 and Midazolam, Dexamethasone and Ondansetron  Airway Management Planned: Oral ETT  Additional Equipment:   Intra-op Plan:   Post-operative Plan: Extubation in OR  Informed Consent: I have reviewed the patients History and Physical, chart, labs and discussed the procedure including the risks, benefits and alternatives for the proposed anesthesia with the patient or authorized representative who has indicated his/her understanding and acceptance.     Dental advisory given  Plan Discussed with: CRNA  Anesthesia Plan Comments: (2nd PIV after induction)       Anesthesia Quick Evaluation

## 2022-05-23 ENCOUNTER — Encounter (HOSPITAL_COMMUNITY)
Admission: RE | Disposition: A | Discharge status: Home / Self Care | Payer: Self-pay | Source: Home / Self Care | Attending: Neurological Surgery

## 2022-05-23 ENCOUNTER — Observation Stay (HOSPITAL_COMMUNITY)
Admission: RE | Admit: 2022-05-23 | Discharge: 2022-05-24 | Disposition: A | Discharge status: Home / Self Care | Payer: BC Managed Care – PPO | Attending: Neurological Surgery | Admitting: Neurological Surgery

## 2022-05-23 ENCOUNTER — Other Ambulatory Visit: Payer: Self-pay

## 2022-05-23 ENCOUNTER — Ambulatory Visit (HOSPITAL_COMMUNITY): Payer: BC Managed Care – PPO

## 2022-05-23 ENCOUNTER — Ambulatory Visit (HOSPITAL_COMMUNITY): Payer: BC Managed Care – PPO | Admitting: Anesthesiology

## 2022-05-23 ENCOUNTER — Encounter (HOSPITAL_COMMUNITY): Payer: Self-pay | Admitting: Neurological Surgery

## 2022-05-23 DIAGNOSIS — M4317 Spondylolisthesis, lumbosacral region: Principal | ICD-10-CM | POA: Insufficient documentation

## 2022-05-23 DIAGNOSIS — I10 Essential (primary) hypertension: Secondary | ICD-10-CM | POA: Insufficient documentation

## 2022-05-23 DIAGNOSIS — M5417 Radiculopathy, lumbosacral region: Secondary | ICD-10-CM | POA: Diagnosis not present

## 2022-05-23 DIAGNOSIS — M4327 Fusion of spine, lumbosacral region: Secondary | ICD-10-CM | POA: Diagnosis present

## 2022-05-23 DIAGNOSIS — Z87891 Personal history of nicotine dependence: Secondary | ICD-10-CM | POA: Diagnosis not present

## 2022-05-23 DIAGNOSIS — M4807 Spinal stenosis, lumbosacral region: Secondary | ICD-10-CM | POA: Insufficient documentation

## 2022-05-23 DIAGNOSIS — Z79899 Other long term (current) drug therapy: Secondary | ICD-10-CM | POA: Diagnosis not present

## 2022-05-23 DIAGNOSIS — Z981 Arthrodesis status: Secondary | ICD-10-CM | POA: Diagnosis not present

## 2022-05-23 DIAGNOSIS — E119 Type 2 diabetes mellitus without complications: Secondary | ICD-10-CM | POA: Insufficient documentation

## 2022-05-23 DIAGNOSIS — M5416 Radiculopathy, lumbar region: Secondary | ICD-10-CM | POA: Diagnosis not present

## 2022-05-23 DIAGNOSIS — Z7985 Long-term (current) use of injectable non-insulin antidiabetic drugs: Secondary | ICD-10-CM | POA: Insufficient documentation

## 2022-05-23 LAB — GLUCOSE, CAPILLARY
Glucose-Capillary: 118 mg/dL — ABNORMAL HIGH (ref 70–99)
Glucose-Capillary: 127 mg/dL — ABNORMAL HIGH (ref 70–99)
Glucose-Capillary: 166 mg/dL — ABNORMAL HIGH (ref 70–99)
Glucose-Capillary: 136 mg/dL — ABNORMAL HIGH (ref 70–99)

## 2022-05-23 LAB — ABO/RH: ABO/RH(D): A POS

## 2022-05-23 SURGERY — POSTERIOR LUMBAR FUSION 1 LEVEL
Anesthesia: General | Site: Back

## 2022-05-23 MED ORDER — ROCURONIUM BROMIDE 10 MG/ML (PF) SYRINGE
PREFILLED_SYRINGE | INTRAVENOUS | Status: DC | PRN
  Administered 2022-05-23: 20 mg via INTRAVENOUS
  Administered 2022-05-23: 60 mg via INTRAVENOUS
  Administered 2022-05-23 (×2): 20 mg via INTRAVENOUS

## 2022-05-23 MED ORDER — LIDOCAINE 2% (20 MG/ML) 5 ML SYRINGE
INTRAMUSCULAR | Status: DC | PRN
  Administered 2022-05-23: 80 mg via INTRAVENOUS

## 2022-05-23 MED ORDER — CHLORHEXIDINE GLUCONATE 0.12 % MT SOLN
15.0000 mL | Freq: Once | OROMUCOSAL | Status: AC
  Administered 2022-05-23: 15 mL via OROMUCOSAL
  Filled 2022-05-23: qty 15

## 2022-05-23 MED ORDER — ONDANSETRON HCL 4 MG/2ML IJ SOLN
INTRAMUSCULAR | Status: DC | PRN
  Administered 2022-05-23: 4 mg via INTRAVENOUS

## 2022-05-23 MED ORDER — LACTATED RINGERS IV SOLN: INTRAVENOUS | Status: DC | PRN

## 2022-05-23 MED ORDER — INSULIN ASPART 100 UNIT/ML IJ SOLN: 0.0000 [IU] | Freq: Every day | INTRAMUSCULAR | Status: DC

## 2022-05-23 MED ORDER — THROMBIN 20000 UNITS EX SOLR: CUTANEOUS | Status: DC | PRN

## 2022-05-23 MED ORDER — SENNA 8.6 MG PO TABS
1.0000 | ORAL_TABLET | Freq: Two times a day (BID) | ORAL | Status: DC
  Administered 2022-05-23 (×2): 8.6 mg via ORAL
  Filled 2022-05-23 (×2): qty 1

## 2022-05-23 MED ORDER — DEXAMETHASONE SODIUM PHOSPHATE 10 MG/ML IJ SOLN
INTRAMUSCULAR | Status: DC | PRN
  Administered 2022-05-23: 5 mg via INTRAVENOUS

## 2022-05-23 MED ORDER — CYCLOBENZAPRINE HCL 10 MG PO TABS
10.0000 mg | ORAL_TABLET | Freq: Three times a day (TID) | ORAL | Status: DC | PRN
  Administered 2022-05-23: 10 mg via ORAL
  Filled 2022-05-23: qty 1

## 2022-05-23 MED ORDER — 0.9 % SODIUM CHLORIDE (POUR BTL) OPTIME
TOPICAL | Status: DC | PRN
  Administered 2022-05-23: 1000 mL

## 2022-05-23 MED ORDER — SUGAMMADEX SODIUM 200 MG/2ML IV SOLN
INTRAVENOUS | Status: DC | PRN
  Administered 2022-05-23: 200 mg via INTRAVENOUS

## 2022-05-23 MED ORDER — SODIUM CHLORIDE 0.9% FLUSH: 3.0000 mL | INTRAVENOUS | Status: DC | PRN

## 2022-05-23 MED ORDER — BUPIVACAINE HCL (PF) 0.25 % IJ SOLN
INTRAMUSCULAR | Status: DC | PRN
  Administered 2022-05-23: 14 mL

## 2022-05-23 MED ORDER — THROMBIN 5000 UNITS EX SOLR: OROMUCOSAL | Status: DC | PRN

## 2022-05-23 MED ORDER — CEFAZOLIN SODIUM-DEXTROSE 2-4 GM/100ML-% IV SOLN
2.0000 g | INTRAVENOUS | Status: AC
  Administered 2022-05-23: 2 g via INTRAVENOUS
  Filled 2022-05-23: qty 100

## 2022-05-23 MED ORDER — MENTHOL 3 MG MT LOZG: 1.0000 | LOZENGE | OROMUCOSAL | Status: DC | PRN

## 2022-05-23 MED ORDER — LOSARTAN POTASSIUM 50 MG PO TABS
25.0000 mg | ORAL_TABLET | Freq: Every day | ORAL | Status: DC
  Administered 2022-05-23: 25 mg via ORAL
  Filled 2022-05-23: qty 1

## 2022-05-23 MED ORDER — MIDAZOLAM HCL 5 MG/5ML IJ SOLN
INTRAMUSCULAR | Status: DC | PRN
  Administered 2022-05-23: 2 mg via INTRAVENOUS

## 2022-05-23 MED ORDER — FENTANYL CITRATE (PF) 250 MCG/5ML IJ SOLN
INTRAMUSCULAR | Status: AC
  Filled 2022-05-23: qty 5

## 2022-05-23 MED ORDER — INSULIN ASPART 100 UNIT/ML IJ SOLN
0.0000 [IU] | Freq: Three times a day (TID) | INTRAMUSCULAR | Status: DC
  Administered 2022-05-23: 3 [IU] via SUBCUTANEOUS

## 2022-05-23 MED ORDER — ACETAMINOPHEN 500 MG PO TABS
1000.0000 mg | ORAL_TABLET | Freq: Four times a day (QID) | ORAL | Status: AC
  Administered 2022-05-23 – 2022-05-24 (×4): 1000 mg via ORAL
  Filled 2022-05-23 (×5): qty 2

## 2022-05-23 MED ORDER — GABAPENTIN 300 MG PO CAPS
300.0000 mg | ORAL_CAPSULE | Freq: Two times a day (BID) | ORAL | Status: DC
  Administered 2022-05-23 (×2): 300 mg via ORAL
  Filled 2022-05-23 (×2): qty 1

## 2022-05-23 MED ORDER — PROPOFOL 10 MG/ML IV BOLUS
INTRAVENOUS | Status: DC | PRN
  Administered 2022-05-23: 150 mg via INTRAVENOUS

## 2022-05-23 MED ORDER — PROPOFOL 10 MG/ML IV BOLUS
INTRAVENOUS | Status: AC
  Filled 2022-05-23: qty 20

## 2022-05-23 MED ORDER — FENTANYL CITRATE (PF) 250 MCG/5ML IJ SOLN
INTRAMUSCULAR | Status: DC | PRN
  Administered 2022-05-23: 50 ug via INTRAVENOUS
  Administered 2022-05-23: 100 ug via INTRAVENOUS
  Administered 2022-05-23 (×3): 50 ug via INTRAVENOUS

## 2022-05-23 MED ORDER — MIDAZOLAM HCL 2 MG/2ML IJ SOLN
INTRAMUSCULAR | Status: AC
  Filled 2022-05-23: qty 2

## 2022-05-23 MED ORDER — FENTANYL CITRATE (PF) 100 MCG/2ML IJ SOLN
INTRAMUSCULAR | Status: AC
  Filled 2022-05-23: qty 2

## 2022-05-23 MED ORDER — LACTATED RINGERS IV SOLN: INTRAVENOUS | Status: DC

## 2022-05-23 MED ORDER — ALBUMIN HUMAN 5 % IV SOLN: INTRAVENOUS | Status: DC | PRN

## 2022-05-23 MED ORDER — CEFAZOLIN SODIUM-DEXTROSE 2-4 GM/100ML-% IV SOLN
2.0000 g | Freq: Three times a day (TID) | INTRAVENOUS | Status: AC
  Administered 2022-05-23 (×2): 2 g via INTRAVENOUS
  Filled 2022-05-23 (×2): qty 100

## 2022-05-23 MED ORDER — BUPIVACAINE HCL (PF) 0.25 % IJ SOLN
INTRAMUSCULAR | Status: AC
  Filled 2022-05-23: qty 30

## 2022-05-23 MED ORDER — ONDANSETRON HCL 4 MG PO TABS: 4.0000 mg | ORAL_TABLET | Freq: Four times a day (QID) | ORAL | Status: DC | PRN

## 2022-05-23 MED ORDER — METHOCARBAMOL 500 MG PO TABS
500.0000 mg | ORAL_TABLET | Freq: Four times a day (QID) | ORAL | Status: DC | PRN
  Administered 2022-05-23: 500 mg via ORAL
  Filled 2022-05-23: qty 1

## 2022-05-23 MED ORDER — SODIUM CHLORIDE 0.9% FLUSH: 3.0000 mL | Freq: Two times a day (BID) | INTRAVENOUS | Status: DC

## 2022-05-23 MED ORDER — ORAL CARE MOUTH RINSE: 15.0000 mL | Freq: Once | OROMUCOSAL | Status: AC

## 2022-05-23 MED ORDER — CHLORHEXIDINE GLUCONATE CLOTH 2 % EX PADS: 6.0000 | MEDICATED_PAD | Freq: Once | CUTANEOUS | Status: DC

## 2022-05-23 MED ORDER — POTASSIUM CHLORIDE IN NACL 20-0.9 MEQ/L-% IV SOLN: INTRAVENOUS | Status: DC

## 2022-05-23 MED ORDER — SODIUM CHLORIDE 0.9 % IV SOLN: 250.0000 mL | INTRAVENOUS | Status: DC

## 2022-05-23 MED ORDER — METHOCARBAMOL 1000 MG/10ML IJ SOLN: 500.0000 mg | Freq: Four times a day (QID) | INTRAVENOUS | Status: DC | PRN

## 2022-05-23 MED ORDER — INSULIN ASPART 100 UNIT/ML IJ SOLN: 0.0000 [IU] | INTRAMUSCULAR | Status: DC | PRN

## 2022-05-23 MED ORDER — PHENOL 1.4 % MT LIQD: 1.0000 | OROMUCOSAL | Status: DC | PRN

## 2022-05-23 MED ORDER — INSULIN ASPART 100 UNIT/ML IJ SOLN: 0.0000 [IU] | Freq: Three times a day (TID) | INTRAMUSCULAR | Status: DC

## 2022-05-23 MED ORDER — ONDANSETRON HCL 4 MG/2ML IJ SOLN: 4.0000 mg | Freq: Four times a day (QID) | INTRAMUSCULAR | Status: DC | PRN

## 2022-05-23 MED ORDER — PROMETHAZINE HCL 25 MG/ML IJ SOLN: 6.2500 mg | INTRAMUSCULAR | Status: DC | PRN

## 2022-05-23 MED ORDER — HYDROMORPHONE HCL 1 MG/ML IJ SOLN: 0.5000 mg | INTRAMUSCULAR | Status: DC | PRN

## 2022-05-23 MED ORDER — PHENYLEPHRINE HCL-NACL 20-0.9 MG/250ML-% IV SOLN
INTRAVENOUS | Status: DC | PRN
  Administered 2022-05-23: 25 ug/min via INTRAVENOUS

## 2022-05-23 MED ORDER — THROMBIN 5000 UNITS EX SOLR
CUTANEOUS | Status: AC
  Filled 2022-05-23: qty 5000

## 2022-05-23 MED ORDER — ACETAMINOPHEN 500 MG PO TABS
1000.0000 mg | ORAL_TABLET | ORAL | Status: AC
  Administered 2022-05-23: 1000 mg via ORAL
  Filled 2022-05-23: qty 2

## 2022-05-23 MED ORDER — PANTOPRAZOLE SODIUM 40 MG PO TBEC
40.0000 mg | DELAYED_RELEASE_TABLET | Freq: Every day | ORAL | Status: DC
  Administered 2022-05-23: 40 mg via ORAL
  Filled 2022-05-23: qty 1

## 2022-05-23 MED ORDER — GABAPENTIN 300 MG PO CAPS
300.0000 mg | ORAL_CAPSULE | ORAL | Status: AC
  Administered 2022-05-23: 300 mg via ORAL
  Filled 2022-05-23: qty 1

## 2022-05-23 MED ORDER — FENTANYL CITRATE (PF) 100 MCG/2ML IJ SOLN
25.0000 ug | INTRAMUSCULAR | Status: DC | PRN
  Administered 2022-05-23: 25 ug via INTRAVENOUS

## 2022-05-23 MED ORDER — OXYCODONE HCL 5 MG PO TABS
10.0000 mg | ORAL_TABLET | ORAL | Status: DC | PRN
  Administered 2022-05-23 – 2022-05-24 (×4): 10 mg via ORAL
  Filled 2022-05-23 (×4): qty 2

## 2022-05-23 MED ORDER — THROMBIN 20000 UNITS EX SOLR
CUTANEOUS | Status: AC
  Filled 2022-05-23: qty 20000

## 2022-05-23 SURGICAL SUPPLY — 63 items
ADH SKN CLS APL DERMABOND .7 (GAUZE/BANDAGES/DRESSINGS) ×2
APL SKNCLS STERI-STRIP NONHPOA (GAUZE/BANDAGES/DRESSINGS) ×2
BAG COUNTER SPONGE SURGICOUNT (BAG) ×2 IMPLANT
BAG SPNG CNTER NS LX DISP (BAG) ×2
BASKET BONE COLLECTION (BASKET) ×2 IMPLANT
BENZOIN TINCTURE PRP APPL 2/3 (GAUZE/BANDAGES/DRESSINGS) ×2 IMPLANT
BLADE BONE MILL MEDIUM (MISCELLANEOUS) ×2 IMPLANT
BLADE CLIPPER SURG (BLADE) IMPLANT
BONE FIBERS PLIAFX 10 (Bone Implant) ×2 IMPLANT
BUR CARBIDE MATCH 3.0 (BURR) ×2 IMPLANT
CANISTER SUCT 3000ML PPV (MISCELLANEOUS) ×2 IMPLANT
CNTNR URN SCR LID CUP LEK RST (MISCELLANEOUS) ×2 IMPLANT
CONT SPEC 4OZ STRL OR WHT (MISCELLANEOUS) ×2
COVER BACK TABLE 60X90IN (DRAPES) ×2 IMPLANT
COVERAGE SUPPORT O-ARM STEALTH (MISCELLANEOUS) ×2 IMPLANT
DERMABOND ADVANCED .7 DNX12 (GAUZE/BANDAGES/DRESSINGS) ×2 IMPLANT
DRAPE C-ARM 42X72 X-RAY (DRAPES) ×4 IMPLANT
DRAPE LAPAROTOMY 100X72X124 (DRAPES) ×2 IMPLANT
DRAPE SHEET LG 3/4 BI-LAMINATE (DRAPES) ×8 IMPLANT
DRAPE SURG 17X23 STRL (DRAPES) ×2 IMPLANT
DRSG OPSITE POSTOP 4X6 (GAUZE/BANDAGES/DRESSINGS) ×1 IMPLANT
DURAPREP 26ML APPLICATOR (WOUND CARE) ×2 IMPLANT
ELECT REM PT RETURN 9FT ADLT (ELECTROSURGICAL) ×2
ELECTRODE REM PT RTRN 9FT ADLT (ELECTROSURGICAL) ×2 IMPLANT
EVACUATOR 1/8 PVC DRAIN (DRAIN) ×2 IMPLANT
FEE COVERAGE SUPPORT O-ARM (MISCELLANEOUS) ×1 IMPLANT
GAUZE 4X4 16PLY ~~LOC~~+RFID DBL (SPONGE) IMPLANT
GLOVE BIO SURGEON STRL SZ7 (GLOVE) IMPLANT
GLOVE BIO SURGEON STRL SZ8 (GLOVE) ×4 IMPLANT
GLOVE BIOGEL PI IND STRL 7.0 (GLOVE) IMPLANT
GOWN STRL REUS W/ TWL LRG LVL3 (GOWN DISPOSABLE) IMPLANT
GOWN STRL REUS W/ TWL XL LVL3 (GOWN DISPOSABLE) ×4 IMPLANT
GOWN STRL REUS W/TWL 2XL LVL3 (GOWN DISPOSABLE) IMPLANT
GOWN STRL REUS W/TWL LRG LVL3 (GOWN DISPOSABLE)
GOWN STRL REUS W/TWL XL LVL3 (GOWN DISPOSABLE) ×4
GRAFT BNE FBR PLIAFX PRIME 10 (Bone Implant) IMPLANT
GRAFT BONE PROTEIOS XL 10CC (Orthopedic Implant) ×1 IMPLANT
HEMOSTAT POWDER KIT SURGIFOAM (HEMOSTASIS) ×2 IMPLANT
KIT BASIN OR (CUSTOM PROCEDURE TRAY) ×2 IMPLANT
KIT TURNOVER KIT B (KITS) ×2 IMPLANT
MARKER SPHERE PSV REFLC NDI (MISCELLANEOUS) ×5 IMPLANT
NDL HYPO 25X1 1.5 SAFETY (NEEDLE) ×1 IMPLANT
NEEDLE HYPO 25X1 1.5 SAFETY (NEEDLE) ×2 IMPLANT
NS IRRIG 1000ML POUR BTL (IV SOLUTION) ×2 IMPLANT
PACK LAMINECTOMY NEURO (CUSTOM PROCEDURE TRAY) ×2 IMPLANT
PAD ARMBOARD 7.5X6 YLW CONV (MISCELLANEOUS) ×6 IMPLANT
ROD LORD LIPPED TI 5.5X45 (Rod) ×2 IMPLANT
SCREW KODIAK 6.5X45 (Screw) ×2 IMPLANT
SCREW KODIAK 6.5X50MM (Screw) ×2 IMPLANT
SET SCREW (Screw) ×8 IMPLANT
SET SCREW SPNE (Screw) ×4 IMPLANT
SPACER IDENTI PS 9X9X25 5D (Spacer) ×2 IMPLANT
SPONGE SURGIFOAM ABS GEL 100 (HEMOSTASIS) ×2 IMPLANT
SPONGE T-LAP 4X18 ~~LOC~~+RFID (SPONGE) IMPLANT
STRIP CLOSURE SKIN 1/2X4 (GAUZE/BANDAGES/DRESSINGS) ×3 IMPLANT
SUT VIC AB 0 CT1 18XCR BRD8 (SUTURE) ×2 IMPLANT
SUT VIC AB 0 CT1 8-18 (SUTURE) ×2
SUT VIC AB 2-0 CP2 18 (SUTURE) ×2 IMPLANT
SUT VIC AB 3-0 SH 8-18 (SUTURE) ×4 IMPLANT
TOWEL GREEN STERILE (TOWEL DISPOSABLE) ×2 IMPLANT
TOWEL GREEN STERILE FF (TOWEL DISPOSABLE) ×2 IMPLANT
TRAY FOLEY MTR SLVR 16FR STAT (SET/KITS/TRAYS/PACK) ×2 IMPLANT
WATER STERILE IRR 1000ML POUR (IV SOLUTION) ×2 IMPLANT

## 2022-05-23 NOTE — Anesthesia Procedure Notes (Signed)
Procedure Name: Intubation Date/Time: 05/23/2022 7:37 AM  Performed by: Glynda Jaeger, CRNAPre-anesthesia Checklist: Patient identified, Patient being monitored, Timeout performed, Emergency Drugs available and Suction available Patient Re-evaluated:Patient Re-evaluated prior to induction Oxygen Delivery Method: Circle System Utilized Preoxygenation: Pre-oxygenation with 100% oxygen Induction Type: IV induction Ventilation: Mask ventilation without difficulty Laryngoscope Size: Mac Grade View: Grade II Tube type: Oral Tube size: 7.5 mm Number of attempts: 1 Airway Equipment and Method: Stylet Placement Confirmation: ETT inserted through vocal cords under direct vision, positive ETCO2 and breath sounds checked- equal and bilateral Secured at: 22 cm Tube secured with: Tape Dental Injury: Teeth and Oropharynx as per pre-operative assessment

## 2022-05-23 NOTE — Transfer of Care (Signed)
Immediate Anesthesia Transfer of Care Note  Patient: Crystal Perry  Procedure(s) Performed: LUMBAR FIVE-SACRAL ONE POSTERIOR LUMBAR INTERBODY FUSION (Back) Application of O-Arm  Patient Location: PACU  Anesthesia Type:General  Level of Consciousness: awake, alert , patient cooperative, and responds to stimulation  Airway & Oxygen Therapy: Patient Spontanous Breathing and Patient connected to face mask oxygen  Post-op Assessment: Report given to RN and Post -op Vital signs reviewed and stable  Post vital signs: Reviewed and stable  Last Vitals:  Vitals Value Taken Time  BP 132/91 05/23/22 1130  Temp    Pulse 89 05/23/22 1131  Resp 17 05/23/22 1132  SpO2 100 % 05/23/22 1131  Vitals shown include unvalidated device data.  Last Pain:  Vitals:   05/23/22 0612  TempSrc:   PainSc: 8          Complications: No notable events documented.

## 2022-05-23 NOTE — Op Note (Signed)
05/23/2022  11:22 AM  PATIENT:  Crystal Perry  51 y.o. female  PRE-OPERATIVE DIAGNOSIS: Lytic spondylolisthesis L5-S1, foraminal stenosis L5-S1, back pain with radiculopathy  POST-OPERATIVE DIAGNOSIS:  same  PROCEDURE:   1.  Gill type decompressive lumbar laminectomy, hemi facetectomy and foraminotomies L5-S1 requiring more work than would be required for a simple exposure of the disk for PLIF in order to adequately decompress the neural elements and address the spinal stenosis 2. Posterior lumbar interbody fusion L5-S1 using PTI interbody cages packed with morcellized allograft and autograft  3. Posterior fixation L5-S1 using ATEC pedicle screws.  4. Intertransverse arthrodesis L5- S1 right using morcellized autograft and allograft.  SURGEON:  Sherley Bounds, MD  ASSISTANTS: Glenford Peers FNP  ANESTHESIA:  General  EBL: 250 ml  Total I/O In: 3016 [I.V.:1300; IV Piggyback:350] Out: 405 [Urine:155; Blood:250]  BLOOD ADMINISTERED:none  DRAINS: none   INDICATION FOR PROCEDURE: This patient presented with back pain and leg pain, especially on the left. Imaging revealed spondylolisthesis L5-S1 with severe foraminal stenosis. The patient tried a reasonable attempt at conservative medical measures without relief. I recommended decompression and instrumented fusion to address the stenosis as well as the segmental  instability.  Patient understood the risks, benefits, and alternatives and potential outcomes and wished to proceed.  PROCEDURE DETAILS:  The patient was brought to the operating room. After induction of generalized endotracheal anesthesia the patient was rolled into the prone position on chest rolls and all pressure points were padded. The patient's lumbar region was cleaned and then prepped with DuraPrep and draped in the usual sterile fashion. Anesthesia was injected and then a dorsal midline incision was made and carried down to the lumbosacral fascia. The fascia was opened and  the paraspinous musculature was taken down in a subperiosteal fashion to expose L5-S1. A self-retaining retractor was placed. Intraoperative fluoroscopy confirmed my level   I then turned my attention to the decompression and a Gill type decompression was completed at L5-S1 with removal of the entire L5 spinous process and lamina and facet complex. complete lumbar laminectomies, hemi- facetectomies, and foraminotomies were performed at L5-S1.  My nurse practitioner was directly involved in the decompression and exposure of the neural elements. the patient had significant spinal stenosis and this required more work than would be required for a simple exposure of the disc for posterior lumbar interbody fusion which would only require a limited laminotomy. Much more generous decompression and generous foraminotomy was undertaken in order to adequately decompress the neural elements and address the patient's leg pain. The yellow ligament was removed to expose the underlying dura and nerve roots, and generous foraminotomies were performed to adequately decompress the neural elements. Both the exiting and traversing nerve roots were decompressed on both sides until a coronary dilator passed easily along the nerve roots. Once the decompression was complete, I turned my attention to the posterior lumbar interbody fusion. The epidural venous vasculature was coagulated and cut sharply. Disc space was incised and the initial discectomy was performed with pituitary rongeurs. The disc space was distracted with sequential distractors to a height of 9 mm. We then used a series of scrapers and shavers to prepare the endplates for fusion. The midline was prepared with Epstein curettes. Once the complete discectomy was finished, we packed an appropriate sized interbody cage with local autograft and morcellized allograft, gently retracted the nerve root, and tapped the cage into position at L5-S1.  The midline between the cages was  packed with morselized autograft  and allograft.   We then turned our attention to the placement of the pedicle screws. The pedicle screw entry zones were identified utilizing surface landmarks and AP and lateral fluoroscopy. I drilled into each pedicle utilizing the hand drill, probed each pedicle with the pedicle probe, and tapped each pedicle with the appropriate 5.5 tap. We palpated with a ball probe to assure no break in the cortex. We then placed 6.5 x 45 mm pedicle screws into the pedicles bilaterally at L5 and S1 bilaterally.  My nurse practitioner assisted in placement of the pedicle screws.  We then decorticated the transverse processes and laid a mixture of morcellized autograft and allograft out over these to perform intertransverse arthrodesis at L5-S1 on the right. We then placed lordotic rods into the multiaxial screw heads of the pedicle screws and locked these in position with the locking caps and anti-torque device while achieving compression of her grafts. We then checked our construct with AP and lateral fluoroscopy. Irrigated with copious amounts of bacitracin-containing saline solution. Inspected the nerve roots once again to assure adequate decompression, lined to the dura with Gelfoam,  and then we closed the muscle and the fascia with 0 Vicryl. Closed the subcutaneous tissues with 2-0 Vicryl and subcuticular tissues with 3-0 Vicryl. The skin was closed with benzoin and Steri-Strips. Dressing was then applied, the patient was awakened from general anesthesia and transported to the recovery room in stable condition. At the end of the procedure all sponge, needle and instrument counts were correct.   PLAN OF CARE: admit to inpatient  PATIENT DISPOSITION:  PACU - hemodynamically stable.   Delay start of Pharmacological VTE agent (>24hrs) due to surgical blood loss or risk of bleeding:  yes
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
## 2022-05-23 NOTE — Plan of Care (Signed)
  Problem: Education: Goal: Ability to verbalize activity precautions or restrictions will improve Outcome: Completed/Met Goal: Knowledge of the prescribed therapeutic regimen will improve Outcome: Completed/Met Goal: Understanding of discharge needs will improve Outcome: Completed/Met   

## 2022-05-24 DIAGNOSIS — Z79899 Other long term (current) drug therapy: Secondary | ICD-10-CM | POA: Diagnosis not present

## 2022-05-24 DIAGNOSIS — Z87891 Personal history of nicotine dependence: Secondary | ICD-10-CM | POA: Diagnosis not present

## 2022-05-24 DIAGNOSIS — Z7985 Long-term (current) use of injectable non-insulin antidiabetic drugs: Secondary | ICD-10-CM | POA: Diagnosis not present

## 2022-05-24 DIAGNOSIS — M4807 Spinal stenosis, lumbosacral region: Secondary | ICD-10-CM | POA: Diagnosis not present

## 2022-05-24 DIAGNOSIS — M4317 Spondylolisthesis, lumbosacral region: Secondary | ICD-10-CM | POA: Diagnosis not present

## 2022-05-24 DIAGNOSIS — I10 Essential (primary) hypertension: Secondary | ICD-10-CM | POA: Diagnosis not present

## 2022-05-24 DIAGNOSIS — E119 Type 2 diabetes mellitus without complications: Secondary | ICD-10-CM | POA: Diagnosis not present

## 2022-05-24 DIAGNOSIS — M5417 Radiculopathy, lumbosacral region: Secondary | ICD-10-CM | POA: Diagnosis not present

## 2022-05-24 LAB — GLUCOSE, CAPILLARY: Glucose-Capillary: 110 mg/dL — ABNORMAL HIGH (ref 70–99)

## 2022-05-24 MED ORDER — OXYCODONE HCL 10 MG PO TABS: 10.0000 mg | ORAL_TABLET | ORAL | 0 refills | Status: AC | PRN

## 2022-05-24 MED ORDER — CYCLOBENZAPRINE HCL 10 MG PO TABS: 10.0000 mg | ORAL_TABLET | Freq: Three times a day (TID) | ORAL | 2 refills | Status: AC | PRN

## 2022-05-24 NOTE — Progress Notes (Signed)
OT Cancellation Note  Patient Details Name: IDELL HISSONG MRN: [id number] DOB: 1971-01-07   Cancelled Treatment:    Reason Eval/Treat Not Completed: OT screened, no needs identified, will sign off. Pt's RN reporting pt ready for discharge at this moment. Entering room ~6 minutes for education regarding ADL. Pt unable to perform figure 4 for LBD at this time but was able to previously; reporting daughter can help until return of mobility. Performing functional mobility independently. Reviewing techniques for LB dressing, toileting, bed mobility, grooming, and shower transfer and pt independent. No additional acute needs identified. OT to sign off.   Elder Cyphers, OTR/L [facility] Acute Rehabilitation Office: [phone]   Magnus Ivan 05/24/2022, 9:21 AM

## 2022-05-24 NOTE — Evaluation (Signed)
Physical Therapy Evaluation Patient Details Name: PANAGIOTA PERFETTI MRN: [id number] DOB: May 02, 1971 Today's Date: 05/24/2022  History of Present Illness  Pt is a 51 y.o. female s/p PLIF. PMH:  DM, HTN, and SVD.  Clinical Impression  PT eval complete. Pt demonstrates mod I bed mobility and transfers. Supervision provided for amb 300' without AD.  Steady gait noted. Reviewed back precautions and brace wear schedule. All education complete. Pt without further questions or concerns. Plan for d/c home today. No follow up PT services indicated. PT signing off.        Recommendations for follow up therapy are one component of a multi-disciplinary discharge planning process, led by the attending physician.  Recommendations may be updated based on patient status, additional functional criteria and insurance authorization.  Follow Up Recommendations No PT follow up      Assistance Recommended at Discharge PRN  Patient can return home with the following  Assistance with cooking/housework;Assist for transportation;A little help with bathing/dressing/bathroom    Equipment Recommendations None recommended by PT  Recommendations for Other Services       Functional Status Assessment Patient has had a recent decline in their functional status and demonstrates the ability to make significant improvements in function in a reasonable and predictable amount of time.     Precautions / Restrictions Precautions Precautions: Back Precaution Comments: Reviewed 3/3 back precautions. Handout in room. Required Braces or Orthoses: Spinal Brace Spinal Brace: Lumbar corset;Applied in sitting position      Mobility  Bed Mobility Overal bed mobility: Modified Independent             General bed mobility comments: reviewed logroll technique. Educated on use of pillow between knees when side sleeping.    Transfers Overall transfer level: Modified independent                 General transfer  comment: increased time    Ambulation/Gait Ambulation/Gait assistance: Supervision Gait Distance (Feet): 300 Feet Assistive device: None Gait Pattern/deviations: Step-through pattern, Decreased stride length Gait velocity: decreased Gait velocity interpretation: 1.31 - 2.62 ft/sec, indicative of limited community ambulator   General Gait Details: steady Development worker, international aid    Modified Rankin (Stroke Patients Only)       Balance Overall balance assessment: No apparent balance deficits (not formally assessed)                                           Pertinent Vitals/Pain Pain Assessment Pain Assessment: 0-10 Pain Score: 6  Pain Location: back Pain Descriptors / Indicators: Discomfort, Sore Pain Intervention(s): Monitored during session, Repositioned    Home Living Family/patient expects to be discharged to:: Private residence Living Arrangements: Children (daughter) Available Help at Discharge: Family;Available 24 hours/day Type of Home: Other(Comment) (townhouse) Home Access: Level entry       Home Layout: One level Home Equipment: BSC/3in1 Additional Comments: Pt is borrowing 3n1 from her mother to use over standard height toilet.    Prior Function Prior Level of Function : Independent/Modified Independent;Driving                     Hand Dominance        Extremity/Trunk Assessment   Upper Extremity Assessment Upper Extremity Assessment: Defer to OT evaluation    Lower Extremity Assessment  Lower Extremity Assessment: Overall WFL for tasks assessed    Cervical / Trunk Assessment Cervical / Trunk Assessment: Back Surgery  Communication   Communication: No difficulties  Cognition Arousal/Alertness: Awake/alert Behavior During Therapy: WFL for tasks assessed/performed Overall Cognitive Status: Within Functional Limits for tasks assessed                                           General Comments      Exercises     Assessment/Plan    PT Assessment Patient does not need any further PT services  PT Problem List         PT Treatment Interventions      PT Goals (Current goals can be found in the Care Plan section)  Acute Rehab PT Goals Patient Stated Goal: home today PT Goal Formulation: All assessment and education complete, DC therapy    Frequency       Co-evaluation               AM-PAC PT "6 Clicks" Mobility  Outcome Measure Help needed turning from your back to your side while in a flat bed without using bedrails?: None Help needed moving from lying on your back to sitting on the side of a flat bed without using bedrails?: None Help needed moving to and from a bed to a chair (including a wheelchair)?: None Help needed standing up from a chair using your arms (e.g., wheelchair or bedside chair)?: None Help needed to walk in hospital room?: A Little Help needed climbing 3-5 steps with a railing? : A Little 6 Click Score: 22    End of Session Equipment Utilized During Treatment: Back brace Activity Tolerance: Patient tolerated treatment well Patient left: in chair;with call bell/phone within reach;with family/visitor present Nurse Communication: Mobility status PT Visit Diagnosis: Difficulty in walking, not elsewhere classified (R26.2)    Time: 1962-2297 PT Time Calculation (min) (ACUTE ONLY): 15 min   Charges:   PT Evaluation $PT Eval Low Complexity: 1 Low          Lorrin Goodell, PT  Office # [phone] Pager [phone]   Lorriane Shire 05/24/2022, 8:36 AM

## 2022-05-24 NOTE — Discharge Instructions (Signed)
Wound Care Keep incision covered and dry for three days.   Do not put any creams, lotions, or ointments on incision. Leave steri-strips on back.  They will fall off by themselves. Activity Walk each and every day, increasing distance each day. No lifting greater than 5 lbs.  Avoid excessive back motion. No driving for 2 weeks; may ride as a passenger locally. If provided with back brace, wear when out of bed.  It is not necessary to wear brace in bed. Diet Resume your normal diet.  Return to Work Will be discussed at you follow up appointment. Call Your Doctor If Any of These Occur Redness, drainage, or swelling at the wound.  Temperature greater than 101 degrees. Severe pain not relieved by pain medication. Incision starts to come apart. Follow Up Appt Call [phone])  for problems.  If you have any hardware placed in your spine, you will need an x-ray before your appointment.

## 2022-05-24 NOTE — Discharge Summary (Signed)
Physician Discharge Summary  Patient ID: Crystal Perry MRN: [id number] DOB/AGE: Aug 17, 1970 51 y.o.  Admit date: 05/23/2022 Discharge date: 05/24/2022  Admission Diagnoses: Spondylolisthesis L5-S1  Discharge Diagnoses: Spondylolisthesis L5-S1.  Lumbar radiculopathy Principal Problem:   Fusion of spine, lumbosacral region   Discharged Condition: good  Hospital Course: Patient underwent surgical decompression stabilization at L5-S1 tolerated surgery well.  Consults: None  Significant Diagnostic Studies: None  Treatments: surgery: See op note  Discharge Exam: Blood pressure 107/75, pulse 97, temperature 97.9 F (36.6 C), resp. rate 16, height '5\' 3"'$  (1.6 m), weight 89.8 kg, last menstrual period 03/26/2012, SpO2 96 %. Incision is clean and dry Station and gait are intact.  Disposition: Discharge disposition: 01-Home or Self Care       Discharge Instructions     Call MD for:  redness, tenderness, or signs of infection (pain, swelling, redness, odor or green/yellow discharge around incision site)   Complete by: As directed    Call MD for:  severe uncontrolled pain   Complete by: As directed    Call MD for:  temperature >100.4   Complete by: As directed    Diet - low sodium heart healthy   Complete by: As directed    Discharge wound care:   Complete by: As directed    Per Dr. Ronnald Ramp instruction   Increase activity slowly   Complete by: As directed       Allergies as of 05/24/2022       Reactions   Bactrim [sulfamethoxazole-trimethoprim] Hives        Medication List     TAKE these medications    cyclobenzaprine 10 MG tablet Commonly known as: FLEXERIL Take 1 tablet (10 mg total) by mouth 3 (three) times daily as needed for muscle spasms.   Fish Oil 1000 MG Caps Take 1,000 mg by mouth once a week.   gabapentin 300 MG capsule Commonly known as: NEURONTIN Take 300 mg by mouth 2 (two) times daily.   losartan 25 MG tablet Commonly known as: COZAAR Take  25 mg by mouth daily.   ondansetron 4 MG disintegrating tablet Commonly known as: ZOFRAN-ODT Take 1 tablet (4 mg total) by mouth every 8 (eight) hours as needed for nausea or vomiting.   Oxycodone HCl 10 MG Tabs Take 1 tablet (10 mg total) by mouth every 4 (four) hours as needed for severe pain ((score 7 to 10)).   pantoprazole 40 MG tablet Commonly known as: Protonix Take 1 tablet (40 mg total) by mouth daily.   traMADol 50 MG tablet Commonly known as: ULTRAM Take 50 mg by mouth every 6 (six) hours as needed for moderate pain or severe pain.   Trulicity 8.32 PQ/9.8YM Sopn Generic drug: Dulaglutide Inject 0.75 mg into the skin every Monday.               Durable Medical Equipment  (From admission, onward)           Start     Ordered   05/23/22 1300  DME Walker rolling  Once       Question:  Patient needs a walker to treat with the following condition  Answer:  S/P lumbar fusion   05/23/22 1259   05/23/22 1300  DME 3 n 1  Once        05/23/22 1259              Discharge Care Instructions  (From admission, onward)  Start     Ordered   05/24/22 0000  Discharge wound care:       Comments: Per Dr. Ronnald Ramp instruction   05/24/22 8864             Signed: Blanchie Dessert Pinchas Reither 05/24/2022, 8:22 AM

## 2022-05-24 NOTE — Progress Notes (Signed)
Patient alert and oriented, ambulate, void, surgical dry and clean no sign of infection. D/c instructions explain and given to the patient all questions answered. Pt. D/c home per order

## 2022-05-26 NOTE — Anesthesia Postprocedure Evaluation (Signed)
Anesthesia Post Note  Patient: Crystal Perry  Procedure(s) Performed: LUMBAR FIVE-SACRAL ONE POSTERIOR LUMBAR INTERBODY FUSION (Back)     Patient location during evaluation: PACU Anesthesia Type: General Level of consciousness: awake and alert Pain management: pain level controlled Vital Signs Assessment: post-procedure vital signs reviewed and stable Respiratory status: spontaneous breathing, nonlabored ventilation, respiratory function stable and patient connected to nasal cannula oxygen Cardiovascular status: blood pressure returned to baseline and stable Postop Assessment: no apparent nausea or vomiting Anesthetic complications: no   No notable events documented.  Last Vitals:  Vitals:   05/24/22 0447 05/24/22 0756  BP: 103/62 107/75  Pulse: 80 97  Resp: 20 16  Temp: 36.8 C 36.6 C  SpO2: 97% 96%    Last Pain:  Vitals:   05/24/22 0900  TempSrc:   PainSc: [place]

## 2022-05-27 ENCOUNTER — Other Ambulatory Visit: Payer: Self-pay

## 2022-05-27 ENCOUNTER — Emergency Department (HOSPITAL_COMMUNITY)
Admission: EM | Admit: 2022-05-27 | Discharge: 2022-05-27 | Disposition: A | Discharge status: Home / Self Care | Payer: BC Managed Care – PPO | Attending: Emergency Medicine | Admitting: Emergency Medicine

## 2022-05-27 ENCOUNTER — Encounter (HOSPITAL_COMMUNITY): Payer: Self-pay

## 2022-05-27 DIAGNOSIS — Z4800 Encounter for change or removal of nonsurgical wound dressing: Secondary | ICD-10-CM | POA: Diagnosis not present

## 2022-05-27 DIAGNOSIS — Z5189 Encounter for other specified aftercare: Secondary | ICD-10-CM

## 2022-05-27 DIAGNOSIS — Z4801 Encounter for change or removal of surgical wound dressing: Secondary | ICD-10-CM | POA: Diagnosis not present

## 2022-05-27 NOTE — Discharge Instructions (Signed)
It was a pleasure taking care of you today!   Your dressing was changed today. You may follow up with your Neurosurgeon, Dr. Sherley Bounds for wound recheck as needed. You may follow up with your primary care provider as needed. Return to the ED if you are experiencing increasing/worsening symptoms.

## 2022-05-27 NOTE — ED Provider Notes (Signed)
[facility] EMERGENCY DEPARTMENT Provider Note   CSN: [id number] Arrival date & time: 05/27/22  4098     History  Chief Complaint  Patient presents with   Wound Check     Crystal Perry is a 51 y.o. female who presents to the ED today complaining of a wound check onset today. Pt had lower back surgery on 05/23/22 and notes that she needs her dressing changed. Denies any increased pain to the area. Pt notes that she was told by her Neurosurgeon office that she could come into the ED for dressing change due to her living in [place] and his office being in [place]. Denies drainage, fever, color change, chest pain, shortness of breath.    The history is provided by the patient. No language interpreter was used.       Home Medications Prior to Admission medications   Medication Sig Start Date End Date Taking? Authorizing Provider  cyclobenzaprine (FLEXERIL) 10 MG tablet Take 1 tablet (10 mg total) by mouth 3 (three) times daily as needed for muscle spasms. 05/24/22   Kristeen Miss, MD  Dulaglutide (TRULICITY) 1.19 JY/7.8GN SOPN Inject 0.75 mg into the skin every Monday.    [provider]  gabapentin (NEURONTIN) 300 MG capsule Take 300 mg by mouth 2 (two) times daily.    [provider]  losartan (COZAAR) 25 MG tablet Take 25 mg by mouth daily. 12/01/21   [provider]  Omega-3 Fatty Acids (FISH OIL) 1000 MG CAPS Take 1,000 mg by mouth once a week.    [provider]  ondansetron (ZOFRAN-ODT) 4 MG disintegrating tablet Take 1 tablet (4 mg total) by mouth every 8 (eight) hours as needed for nausea or vomiting. Patient not taking: Reported on 05/15/2022 07/29/21   Davonna Belling, MD  oxyCODONE 10 MG TABS Take 1 tablet (10 mg total) by mouth every 4 (four) hours as needed for severe pain ((score 7 to 10)). 05/24/22   Kristeen Miss, MD  pantoprazole (PROTONIX) 40 MG tablet Take 1 tablet (40 mg total) by mouth daily. Patient not taking: Reported on  05/15/2022 08/02/21 08/02/22  [name], DO  traMADol (ULTRAM) 50 MG tablet Take 50 mg by mouth every 6 (six) hours as needed for moderate pain or severe pain.    [provider]      Allergies    Bactrim [sulfamethoxazole-trimethoprim]    Review of Systems   Review of Systems  All other systems reviewed and are negative.   Physical Exam Updated Vital Signs BP 122/83 (BP Location: Right Arm)   Pulse (!) 105   Temp 99.2 F (37.3 C) (Oral)   Resp 18   Ht '5\' 3"'$  (1.6 m)   Wt 89.8 kg   LMP 03/26/2012   SpO2 98%   BMI 35.07 kg/m  Physical Exam Vitals and nursing note reviewed.  Constitutional:      General: She is not in acute distress.    Appearance: She is not diaphoretic.  HENT:     Head: Normocephalic and atraumatic.     Mouth/Throat:     Pharynx: No oropharyngeal exudate.  Eyes:     General: No scleral icterus.    Conjunctiva/sclera: Conjunctivae normal.  Cardiovascular:     Rate and Rhythm: Normal rate and regular rhythm.     Pulses: Normal pulses.     Heart sounds: Normal heart sounds.  Pulmonary:     Effort: Pulmonary effort is normal. No respiratory distress.  Breath sounds: Normal breath sounds. No wheezing.  Abdominal:     General: Bowel sounds are normal.     Palpations: Abdomen is soft. There is no mass.     Tenderness: There is no abdominal tenderness. There is no guarding or rebound.  Musculoskeletal:        General: Normal range of motion.     Cervical back: Normal range of motion and neck supple.  Skin:    General: Skin is warm and dry.     Comments: No appreciable TTP noted to lumbar spine or musculature. No overlying skin changes. No drainage noted to the area.   Neurological:     Mental Status: She is alert.  Psychiatric:        Behavior: Behavior normal.     ED Results / Procedures / Treatments   Labs (all labs ordered are listed, but only abnormal results are displayed) Labs Reviewed - No data to  display  EKG None  Radiology No results found.  Procedures Procedures    Medications Ordered in ED Medications - No data to display  ED Course/ Medical Decision Making/ A&P Clinical Course as of 05/27/22 1020  Tue May 27, 2022  1019 Notified by RN that patient left after having her dressing changed.  [SB]    Clinical Course User Index [SB] Elenore Wanninger A, PA-C                           Medical Decision Making  Pt presents with concerns for wound check onset today. Pt afebrile. On exam, pt with no appreciable TTP noted to lumbar spine or musculature. No overlying skin changes. No drainage noted to the area.  . No acute cardiovascular, respiratory, abdominal exam findings. Differential diagnosis includes wound check, cellulitis, abscess.    Additional history obtained:  External records from outside source obtained and reviewed including: Pt had surgery to her lumbar (PLIF) on 05/23/22 completed by Dr. Ronnald Ramp.    Disposition: Presentation suspicious for wound check. Doubt cellulitis or abscess at this time. After consideration of the diagnostic results and the patients response to treatment, I feel that the patient would benefit from Discharge home. Supportive care measures and strict return precautions discussed with patient at bedside. Pt acknowledges and verbalizes understanding. Pt appears safe for discharge. Follow up as indicated in discharge paperwork.    This chart was dictated using voice recognition software, Dragon. Despite the best efforts of this provider to proofread and correct errors, errors may still occur which can change documentation meaning.   Final Clinical Impression(s) / ED Diagnoses Final diagnoses:  Visit for wound check    Rx / DC Orders ED Discharge Orders     None         Jayelle Page A, PA-C 05/27/22 [place], [place], DO 05/30/22 [phone]

## 2022-05-27 NOTE — ED Notes (Signed)
PA in to see pt and pt was not in room  This RN unable to obtain VS due to pt not being present

## 2022-05-27 NOTE — ED Notes (Signed)
Meplix dressing applied to back wound

## 2022-05-27 NOTE — ED Triage Notes (Signed)
Pt presents to ED for surgical site check. Pt states she had back surgery on Friday, needs dressing changed. Denies fevers or increased pain

## 2022-05-28 DIAGNOSIS — Z79899 Other long term (current) drug therapy: Secondary | ICD-10-CM | POA: Diagnosis not present

## 2022-05-28 DIAGNOSIS — Z7982 Long term (current) use of aspirin: Secondary | ICD-10-CM | POA: Diagnosis not present

## 2022-05-28 DIAGNOSIS — E119 Type 2 diabetes mellitus without complications: Secondary | ICD-10-CM | POA: Diagnosis not present

## 2022-05-28 DIAGNOSIS — Z5189 Encounter for other specified aftercare: Secondary | ICD-10-CM | POA: Diagnosis not present

## 2022-05-28 DIAGNOSIS — Z882 Allergy status to sulfonamides status: Secondary | ICD-10-CM | POA: Diagnosis not present

## 2022-05-28 DIAGNOSIS — Z4800 Encounter for change or removal of nonsurgical wound dressing: Secondary | ICD-10-CM | POA: Diagnosis not present

## 2022-06-19 DIAGNOSIS — M4317 Spondylolisthesis, lumbosacral region: Secondary | ICD-10-CM | POA: Diagnosis not present

## 2022-07-01 DIAGNOSIS — Z6832 Body mass index (BMI) 32.0-32.9, adult: Secondary | ICD-10-CM | POA: Diagnosis not present

## 2022-07-01 DIAGNOSIS — J0110 Acute frontal sinusitis, unspecified: Secondary | ICD-10-CM | POA: Diagnosis not present

## 2022-07-24 DIAGNOSIS — M4317 Spondylolisthesis, lumbosacral region: Secondary | ICD-10-CM | POA: Diagnosis not present

## 2022-08-07 ENCOUNTER — Encounter: Payer: Self-pay | Admitting: Radiology

## 2022-10-22 ENCOUNTER — Other Ambulatory Visit (HOSPITAL_COMMUNITY): Payer: Self-pay | Admitting: Internal Medicine

## 2022-10-22 DIAGNOSIS — Z1231 Encounter for screening mammogram for malignant neoplasm of breast: Secondary | ICD-10-CM

## 2022-10-27 ENCOUNTER — Inpatient Hospital Stay (HOSPITAL_COMMUNITY): Admission: RE | Admit: 2022-10-27 | Payer: BC Managed Care – PPO | Source: Ambulatory Visit

## 2022-11-05 ENCOUNTER — Ambulatory Visit (HOSPITAL_COMMUNITY): Payer: Self-pay

## 2023-01-12 ENCOUNTER — Encounter (HOSPITAL_COMMUNITY): Payer: Self-pay | Admitting: Emergency Medicine

## 2023-01-12 ENCOUNTER — Emergency Department (HOSPITAL_COMMUNITY)
Admission: EM | Admit: 2023-01-12 | Discharge: 2023-01-12 | Disposition: A | Discharge status: Home / Self Care | Payer: BC Managed Care – PPO | Attending: Emergency Medicine | Admitting: Emergency Medicine

## 2023-01-12 ENCOUNTER — Other Ambulatory Visit: Payer: Self-pay

## 2023-01-12 ENCOUNTER — Emergency Department (HOSPITAL_COMMUNITY): Payer: BC Managed Care – PPO

## 2023-01-12 DIAGNOSIS — R519 Headache, unspecified: Secondary | ICD-10-CM | POA: Diagnosis not present

## 2023-01-12 DIAGNOSIS — I10 Essential (primary) hypertension: Secondary | ICD-10-CM | POA: Insufficient documentation

## 2023-01-12 DIAGNOSIS — I160 Hypertensive urgency: Secondary | ICD-10-CM | POA: Diagnosis not present

## 2023-01-12 DIAGNOSIS — Z79899 Other long term (current) drug therapy: Secondary | ICD-10-CM | POA: Diagnosis not present

## 2023-01-12 LAB — BASIC METABOLIC PANEL
Anion gap: 6 (ref 5–15)
BUN: 15 mg/dL (ref 6–20)
CO2: 22 mmol/L (ref 22–32)
Calcium: 8.3 mg/dL — ABNORMAL LOW (ref 8.9–10.3)
Chloride: 109 mmol/L (ref 98–111)
Creatinine, Ser: 0.93 mg/dL (ref 0.44–1.00)
GFR, Estimated: >60 mL/min (ref >60)
Glucose, Bld: 107 mg/dL — ABNORMAL HIGH (ref 70–99)
Potassium: 3.5 mmol/L (ref 3.5–5.1)
Sodium: 137 mmol/L (ref 135–145)

## 2023-01-12 LAB — CBC WITH DIFFERENTIAL/PLATELET
Abs Immature Granulocytes: 0.01 10*3/uL (ref 0.00–0.07)
Basophils Absolute: 0.1 10*3/uL (ref 0.0–0.1)
Basophils Relative: 1 %
Eosinophils Absolute: 0.2 10*3/uL (ref 0.0–0.5)
Eosinophils Relative: 4 %
HCT: 36.1 % (ref 36.0–46.0)
Hemoglobin: 11.5 g/dL — ABNORMAL LOW (ref 12.0–15.0)
Immature Granulocytes: 0 %
Lymphocytes Relative: 30 %
Lymphs Abs: 1.3 10*3/uL (ref 0.7–4.0)
MCH: 27.9 pg (ref 26.0–34.0)
MCHC: 31.9 g/dL (ref 30.0–36.0)
MCV: 87.6 fL (ref 80.0–100.0)
Monocytes Absolute: 0.4 10*3/uL (ref 0.1–1.0)
Monocytes Relative: 10 %
Neutro Abs: 2.5 10*3/uL (ref 1.7–7.7)
Neutrophils Relative %: 55 %
Platelets: 342 10*3/uL (ref 150–400)
RBC: 4.12 MIL/uL (ref 3.87–5.11)
RDW: 15 % (ref 11.5–15.5)
WBC: 4.5 10*3/uL (ref 4.0–10.5)
nRBC: 0 % (ref 0.0–0.2)

## 2023-01-12 MED ORDER — HYDROCODONE-ACETAMINOPHEN 5-325 MG PO TABS
2.0000 | ORAL_TABLET | Freq: Once | ORAL | Status: AC
  Administered 2023-01-12: 2 via ORAL
  Filled 2023-01-12: qty 2

## 2023-01-12 NOTE — ED Provider Notes (Signed)
[place] EMERGENCY DEPARTMENT AT [facility] Provider Note   CSN: [id number] Arrival date & time: 01/12/23  0320     History  Chief Complaint  Patient presents with   Hypertension    ZEE PFUHL is a 52 y.o. female.  Patient is a 52 year old female with past medical history of hypertension.  Patient presenting today with complaints of headache and elevated blood pressure.  This has been ongoing for the past several days.  This morning she woke to go to work, checked her blood pressure, and noticed that it was in the 170s.  Patient presents for evaluation of this.  She describes constant pressure in her head, but no weakness, numbness, or visual disturbances.  She denies fevers or chills.  The history is provided by the patient.       Home Medications Prior to Admission medications   Medication Sig Start Date End Date Taking? Authorizing Provider  cyclobenzaprine (FLEXERIL) 10 MG tablet Take 1 tablet (10 mg total) by mouth 3 (three) times daily as needed for muscle spasms. 05/24/22   Barnett Abu, MD  Dulaglutide (TRULICITY) 0.75 MG/0.5ML SOPN Inject 0.75 mg into the skin every Monday.    [provider]  gabapentin (NEURONTIN) 300 MG capsule Take 300 mg by mouth 2 (two) times daily.    [provider]  losartan (COZAAR) 25 MG tablet Take 25 mg by mouth daily. 12/01/21   [provider]  Omega-3 Fatty Acids (FISH OIL) 1000 MG CAPS Take 1,000 mg by mouth once a week.    [provider]  ondansetron (ZOFRAN-ODT) 4 MG disintegrating tablet Take 1 tablet (4 mg total) by mouth every 8 (eight) hours as needed for nausea or vomiting. Patient not taking: Reported on 05/15/2022 07/29/21   Benjiman Core, MD  oxyCODONE 10 MG TABS Take 1 tablet (10 mg total) by mouth every 4 (four) hours as needed for severe pain ((score 7 to 10)). 05/24/22   Barnett Abu, MD  pantoprazole (PROTONIX) 40 MG tablet Take 1 tablet (40 mg total) by mouth  daily. Patient not taking: Reported on 05/15/2022 08/02/21 08/02/22  Lanelle Bal, DO  traMADol (ULTRAM) 50 MG tablet Take 50 mg by mouth every 6 (six) hours as needed for moderate pain or severe pain.    [provider]      Allergies    Bactrim [sulfamethoxazole-trimethoprim]    Review of Systems   Review of Systems  All other systems reviewed and are negative.   Physical Exam Updated Vital Signs BP (!) 177/88 (BP Location: Left Arm)   Temp 99.4 F (37.4 C) (Oral)   Resp 20   Ht 5\' 3"  (1.6 m)   Wt 91.6 kg   LMP 03/26/2012   SpO2 100%   BMI 35.78 kg/m  Physical Exam Vitals and nursing note reviewed.  Constitutional:      General: She is not in acute distress.    Appearance: She is well-developed. She is not diaphoretic.  HENT:     Head: Normocephalic and atraumatic.  Eyes:     Extraocular Movements: Extraocular movements intact.     Pupils: Pupils are equal, round, and reactive to light.  Cardiovascular:     Rate and Rhythm: Normal rate and regular rhythm.     Heart sounds: No murmur heard.    No friction rub. No gallop.  Pulmonary:     Effort: Pulmonary effort is normal. No respiratory distress.     Breath sounds: Normal breath  sounds. No wheezing.  Abdominal:     General: Bowel sounds are normal. There is no distension.     Palpations: Abdomen is soft.     Tenderness: There is no abdominal tenderness.  Musculoskeletal:        General: Normal range of motion.     Cervical back: Normal range of motion and neck supple.  Skin:    General: Skin is warm and dry.  Neurological:     General: No focal deficit present.     Mental Status: She is alert and oriented to person, place, and time.     Cranial Nerves: No cranial nerve deficit.     Motor: No weakness.     ED Results / Procedures / Treatments   Labs (all labs ordered are listed, but only abnormal results are displayed) Labs Reviewed  BASIC METABOLIC PANEL  CBC WITH DIFFERENTIAL/PLATELET     EKG None  Radiology No results found.  Procedures Procedures    Medications Ordered in ED Medications - No data to display  ED Course/ Medical Decision Making/ A&P  Patient is a 52 year old female with history of hypertension presenting with elevated blood pressure and headache as described in the HPI.  Patient arrives here with blood pressure of 144 systolic and is neurologically intact.  Vital signs are stable otherwise.  Workup initiated including CBC and basic metabolic panel, both of which are unremarkable.  CT scan of the head obtained due to the patient's reported persistent headache.  This was negative for acute intracranial process.  Patient was given Norco for her headache and seems to be feeling better.  Her blood pressure has been monitored throughout her ED stay and his maintained in the 140 systolic range.  Patient to be discharged with rotating ibuprofen and Tylenol and follow-up as needed.    Final Clinical Impression(s) / ED Diagnoses Final diagnoses:  None    Rx / DC Orders ED Discharge Orders     None         Geoffery Lyons, MD 01/12/23 [phone]

## 2023-01-12 NOTE — ED Notes (Signed)
Patient transported to CT 

## 2023-01-12 NOTE — Discharge Instructions (Signed)
Continue medications as previously prescribed.  Keep a record of your blood pressures at home and follow-up with your primary doctor to discuss.

## 2023-01-12 NOTE — ED Triage Notes (Signed)
Pt with c/o HTN (140/100). Pt is on BP meds (Losartan 50mg  daily) and took dose already this morning. Pt also c/o headache and pain in back of neck.

## 2023-01-13 DIAGNOSIS — I161 Hypertensive emergency: Secondary | ICD-10-CM | POA: Diagnosis not present

## 2023-01-13 DIAGNOSIS — Z6832 Body mass index (BMI) 32.0-32.9, adult: Secondary | ICD-10-CM | POA: Diagnosis not present

## 2023-01-15 ENCOUNTER — Other Ambulatory Visit (HOSPITAL_COMMUNITY): Payer: Self-pay | Admitting: Radiology

## 2023-01-16 NOTE — Progress Notes (Deleted)
  Cardiology Office Note:  .   Date:  01/16/2023  ID:  Crystal Perry, DOB [birth year], MRN [id number] PCP: Toma Deiters, MD  [facility] Providers Cardiologist:  Nona Dell, MD { Click to update primary MD,subspecialty MD or APP then REFRESH:1}   History of Present Illness: .   Crystal Perry is a 52 y.o. female with history of prediabetes, HTN, normal NST 2022.  Was in ED 01/12/23 with headache and elevated BP 177/88  ROS: ***  Studies Reviewed: Marland Kitchen         Prior CV Studies: {Select studies to display:26339}  ***  Risk Assessment/Calculations:   {Does this patient have ATRIAL FIBRILLATION?[phone]} No BP recorded.  {Refresh Note OR Click here to enter BP  :1}***       Physical Exam:   VS:  LMP 03/26/2012    Wt Readings from Last 3 Encounters:  01/12/23 202 lb (91.6 kg)  05/27/22 198 lb (89.8 kg)  05/23/22 198 lb (89.8 kg)    GEN: Well nourished, well developed in no acute distress NECK: No JVD; No carotid bruits CARDIAC: ***RRR, no murmurs, rubs, gallops RESPIRATORY:  Clear to auscultation without rales, wheezing or rhonchi  ABDOMEN: Soft, non-tender, non-distended EXTREMITIES:  No edema; No deformity   ASSESSMENT AND PLAN: .    HTN  Prediabetes     {Are you ordering a CV Procedure (e.g. stress test, cath, DCCV, TEE, etc)?   Press F2        :[id number]}  Dispo: ***  Signed, Jacolyn Reedy, PA-C

## 2023-01-21 ENCOUNTER — Other Ambulatory Visit (HOSPITAL_COMMUNITY): Payer: Self-pay | Admitting: Internal Medicine

## 2023-01-21 DIAGNOSIS — Z1231 Encounter for screening mammogram for malignant neoplasm of breast: Secondary | ICD-10-CM

## 2023-01-23 ENCOUNTER — Encounter (HOSPITAL_COMMUNITY): Payer: Self-pay

## 2023-01-23 ENCOUNTER — Ambulatory Visit (HOSPITAL_COMMUNITY): Admission: RE | Admit: 2023-01-23 | Payer: BC Managed Care – PPO | Source: Ambulatory Visit

## 2023-01-23 ENCOUNTER — Encounter (HOSPITAL_COMMUNITY): Payer: BC Managed Care – PPO

## 2023-01-23 DIAGNOSIS — Z1231 Encounter for screening mammogram for malignant neoplasm of breast: Secondary | ICD-10-CM | POA: Insufficient documentation

## 2023-01-27 ENCOUNTER — Ambulatory Visit: Payer: BC Managed Care – PPO | Attending: Physician Assistant | Admitting: Physician Assistant

## 2023-04-29 ENCOUNTER — Other Ambulatory Visit: Payer: Self-pay

## 2023-04-29 ENCOUNTER — Emergency Department (HOSPITAL_COMMUNITY)
Admission: EM | Admit: 2023-04-29 | Discharge: 2023-04-29 | Discharge status: Against Medical Advice | Payer: BC Managed Care – PPO | Attending: Emergency Medicine | Admitting: Emergency Medicine

## 2023-04-29 DIAGNOSIS — R110 Nausea: Secondary | ICD-10-CM | POA: Insufficient documentation

## 2023-04-29 DIAGNOSIS — Z5321 Procedure and treatment not carried out due to patient leaving prior to being seen by health care provider: Secondary | ICD-10-CM | POA: Insufficient documentation

## 2023-04-29 DIAGNOSIS — E119 Type 2 diabetes mellitus without complications: Secondary | ICD-10-CM | POA: Insufficient documentation

## 2023-04-29 DIAGNOSIS — R42 Dizziness and giddiness: Secondary | ICD-10-CM

## 2023-04-29 DIAGNOSIS — I959 Hypotension, unspecified: Secondary | ICD-10-CM | POA: Insufficient documentation

## 2023-04-29 DIAGNOSIS — R61 Generalized hyperhidrosis: Secondary | ICD-10-CM | POA: Insufficient documentation

## 2023-04-29 LAB — CBC WITH DIFFERENTIAL/PLATELET
Abs Immature Granulocytes: 0.05 10*3/uL (ref 0.00–0.07)
Basophils Absolute: 0.1 10*3/uL (ref 0.0–0.1)
Basophils Relative: 1 %
Eosinophils Absolute: 0.1 10*3/uL (ref 0.0–0.5)
Eosinophils Relative: 1 %
HCT: 35.8 % — ABNORMAL LOW (ref 36.0–46.0)
Hemoglobin: 11.9 g/dL — ABNORMAL LOW (ref 12.0–15.0)
Immature Granulocytes: 1 %
Lymphocytes Relative: 14 %
Lymphs Abs: 1.4 10*3/uL (ref 0.7–4.0)
MCH: 29 pg (ref 26.0–34.0)
MCHC: 33.2 g/dL (ref 30.0–36.0)
MCV: 87.1 fL (ref 80.0–100.0)
Monocytes Absolute: 0.6 10*3/uL (ref 0.1–1.0)
Monocytes Relative: 6 %
Neutro Abs: 7.4 10*3/uL (ref 1.7–7.7)
Neutrophils Relative %: 77 %
Platelets: 373 10*3/uL (ref 150–400)
RBC: 4.11 MIL/uL (ref 3.87–5.11)
RDW: 15.7 % — ABNORMAL HIGH (ref 11.5–15.5)
WBC: 9.5 10*3/uL (ref 4.0–10.5)
nRBC: 0 % (ref 0.0–0.2)

## 2023-04-29 LAB — COMPREHENSIVE METABOLIC PANEL
ALT: 43 U/L (ref 0–44)
AST: 28 U/L (ref 15–41)
Albumin: 3.4 g/dL — ABNORMAL LOW (ref 3.5–5.0)
Alkaline Phosphatase: 95 U/L (ref 38–126)
Anion gap: 11 (ref 5–15)
BUN: 22 mg/dL — ABNORMAL HIGH (ref 6–20)
CO2: 21 mmol/L — ABNORMAL LOW (ref 22–32)
Calcium: 9.3 mg/dL (ref 8.9–10.3)
Chloride: 104 mmol/L (ref 98–111)
Creatinine, Ser: 1.49 mg/dL — ABNORMAL HIGH (ref 0.44–1.00)
GFR, Estimated: 42 mL/min — ABNORMAL LOW (ref >60)
Glucose, Bld: 166 mg/dL — ABNORMAL HIGH (ref 70–99)
Potassium: 4.2 mmol/L (ref 3.5–5.1)
Sodium: 136 mmol/L (ref 135–145)
Total Bilirubin: 0.5 mg/dL (ref <1.2)
Total Protein: 6.9 g/dL (ref 6.5–8.1)

## 2023-04-29 MED ORDER — SODIUM CHLORIDE 0.9 % IV BOLUS
1000.0000 mL | Freq: Once | INTRAVENOUS | Status: AC
  Administered 2023-04-29: 1000 mL via INTRAVENOUS

## 2023-04-29 NOTE — ED Triage Notes (Signed)
Pt's bp dropped while she was at work. Pt states she became dizzy, sweaty, and nauseous. BP 69/60 per EMS when they arrived. Hx of DM II and CBG 120's per EMS. Pt states she has no other medical hx except for surgeries in the past.

## 2023-08-18 ENCOUNTER — Encounter (HOSPITAL_COMMUNITY): Payer: Self-pay | Admitting: *Deleted

## 2023-08-18 ENCOUNTER — Emergency Department (HOSPITAL_COMMUNITY)
Admission: EM | Admit: 2023-08-18 | Discharge: 2023-08-18 | Disposition: A | Discharge status: Home / Self Care | Attending: Emergency Medicine | Admitting: Emergency Medicine

## 2023-08-18 ENCOUNTER — Other Ambulatory Visit: Payer: Self-pay

## 2023-08-18 ENCOUNTER — Emergency Department (HOSPITAL_COMMUNITY)

## 2023-08-18 DIAGNOSIS — E119 Type 2 diabetes mellitus without complications: Secondary | ICD-10-CM | POA: Diagnosis not present

## 2023-08-18 DIAGNOSIS — Z79899 Other long term (current) drug therapy: Secondary | ICD-10-CM | POA: Insufficient documentation

## 2023-08-18 DIAGNOSIS — R0789 Other chest pain: Secondary | ICD-10-CM | POA: Insufficient documentation

## 2023-08-18 DIAGNOSIS — I10 Essential (primary) hypertension: Secondary | ICD-10-CM | POA: Diagnosis not present

## 2023-08-18 LAB — BASIC METABOLIC PANEL
Anion gap: 7 (ref 5–15)
BUN: 14 mg/dL (ref 6–20)
CO2: 23 mmol/L (ref 22–32)
Calcium: 8.9 mg/dL (ref 8.9–10.3)
Chloride: 110 mmol/L (ref 98–111)
Creatinine, Ser: 1.03 mg/dL — ABNORMAL HIGH (ref 0.44–1.00)
GFR, Estimated: >60 mL/min (ref >60)
Glucose, Bld: 158 mg/dL — ABNORMAL HIGH (ref 70–99)
Potassium: 3.8 mmol/L (ref 3.5–5.1)
Sodium: 140 mmol/L (ref 135–145)

## 2023-08-18 LAB — TROPONIN I (HIGH SENSITIVITY): Troponin I (High Sensitivity): 3 ng/L (ref <18)

## 2023-08-18 LAB — CBC
HCT: 32.7 % — ABNORMAL LOW (ref 36.0–46.0)
Hemoglobin: 10.3 g/dL — ABNORMAL LOW (ref 12.0–15.0)
MCH: 28.6 pg (ref 26.0–34.0)
MCHC: 31.5 g/dL (ref 30.0–36.0)
MCV: 90.8 fL (ref 80.0–100.0)
Platelets: 392 10*3/uL (ref 150–400)
RBC: 3.6 MIL/uL — ABNORMAL LOW (ref 3.87–5.11)
RDW: 14.8 % (ref 11.5–15.5)
WBC: 6.8 10*3/uL (ref 4.0–10.5)
nRBC: 0 % (ref 0.0–0.2)

## 2023-08-18 MED ORDER — HYDROCODONE-ACETAMINOPHEN 5-325 MG PO TABS
2.0000 | ORAL_TABLET | Freq: Once | ORAL | Status: AC
  Administered 2023-08-18: 2 via ORAL
  Filled 2023-08-18: qty 2

## 2023-08-18 NOTE — ED Provider Notes (Signed)
  EMERGENCY DEPARTMENT AT [facility] Provider Note   CSN: [id number] Arrival date & time: 08/18/23  0430     History  Chief Complaint  Patient presents with   Chest Pain    Crystal Perry is a 54 y.o. female.  Patient is a 53 year old female with history of uterine fibroids, hypertension, diabetes.  Patient presenting today for evaluation of chest pain.  Since yesterday evening, she is experienced discomfort to the right upper chest in the absence of any injury or trauma.  The pain is sometimes a pressure, and also at times sharp.  She denies any shortness of breath, nausea, diaphoresis, or radiation to the arm or jaw.  She denies any prior cardiac history, but did have an unremarkable stress test in 2021.  She denies to me she is having exertional symptoms.  The history is provided by the patient.       Home Medications Prior to Admission medications   Medication Sig Start Date End Date Taking? Authorizing Provider  cyclobenzaprine (FLEXERIL) 10 MG tablet Take 1 tablet (10 mg total) by mouth 3 (three) times daily as needed for muscle spasms. 05/24/22   Barnett Abu, MD  Dulaglutide (TRULICITY) 0.75 MG/0.5ML SOPN Inject 0.75 mg into the skin every Monday.    [provider]  gabapentin (NEURONTIN) 300 MG capsule Take 300 mg by mouth 2 (two) times daily.    [provider]  losartan (COZAAR) 25 MG tablet Take 25 mg by mouth daily. 12/01/21   [provider]  olmesartan-hydrochlorothiazide (BENICAR HCT) 20-12.5 MG tablet Take 1 tablet by mouth daily. 04/08/23   [provider]  Omega-3 Fatty Acids (FISH OIL) 1000 MG CAPS Take 1,000 mg by mouth once a week.    [provider]  ondansetron (ZOFRAN-ODT) 4 MG disintegrating tablet Take 1 tablet (4 mg total) by mouth every 8 (eight) hours as needed for nausea or vomiting. Patient not taking: Reported on 05/15/2022 07/29/21   Benjiman Core, MD  oxyCODONE 10 MG TABS Take 1  tablet (10 mg total) by mouth every 4 (four) hours as needed for severe pain ((score 7 to 10)). 05/24/22   Barnett Abu, MD  OZEMPIC, 0.25 OR 0.5 MG/DOSE, 2 MG/3ML SOPN Inject 2 mg into the skin once a week. 03/23/23   [provider]  pantoprazole (PROTONIX) 40 MG tablet Take 1 tablet (40 mg total) by mouth daily. Patient not taking: Reported on 05/15/2022 08/02/21 08/02/22  Lanelle Bal, DO  traMADol (ULTRAM) 50 MG tablet Take 50 mg by mouth every 6 (six) hours as needed for moderate pain or severe pain.    [provider]      Allergies    Bactrim [sulfamethoxazole-trimethoprim]    Review of Systems   Review of Systems  All other systems reviewed and are negative.   Physical Exam Updated Vital Signs BP 137/74   Pulse (!) 49   Temp 98.3 F (36.8 C) (Oral)   Resp (!) 22   Ht 5\' 2"  (1.575 m)   Wt 81.6 kg   LMP 03/26/2012   SpO2 99%   BMI 32.92 kg/m  Physical Exam Vitals and nursing note reviewed.  Constitutional:      General: She is not in acute distress.    Appearance: She is well-developed. She is not diaphoretic.  HENT:     Head: Normocephalic and atraumatic.  Cardiovascular:     Rate and Rhythm: Normal rate and regular rhythm.  Heart sounds: No murmur heard.    No friction rub. No gallop.  Pulmonary:     Effort: Pulmonary effort is normal. No respiratory distress.     Breath sounds: Normal breath sounds. No wheezing.  Abdominal:     General: Bowel sounds are normal. There is no distension.     Palpations: Abdomen is soft.     Tenderness: There is no abdominal tenderness.  Musculoskeletal:        General: Normal range of motion.     Cervical back: Normal range of motion and neck supple.     Right lower leg: No tenderness. No edema.     Left lower leg: No tenderness. No edema.  Skin:    General: Skin is warm and dry.  Neurological:     General: No focal deficit present.     Mental Status: She is alert and oriented to person, place, and  time.     ED Results / Procedures / Treatments   Labs (all labs ordered are listed, but only abnormal results are displayed) Labs Reviewed  BASIC METABOLIC PANEL - Abnormal; Notable for the following components:      Result Value   Glucose, Bld 158 (*)    Creatinine, Ser 1.03 (*)    All other components within normal limits  CBC - Abnormal; Notable for the following components:   RBC 3.60 (*)    Hemoglobin 10.3 (*)    HCT 32.7 (*)    All other components within normal limits  TROPONIN I (HIGH SENSITIVITY)    EKG EKG Interpretation Date/Time:  Tuesday August 18 2023 04:55:05 EDT Ventricular Rate:  55 PR Interval:  194 QRS Duration:  77 QT Interval:  473 QTC Calculation: 453 R Axis:   61  Text Interpretation: Sinus rhythm Borderline T abnormalities, anterior leads No significant change since 04/29/2023 Confirmed by Geoffery Lyons ([id number]) on 08/18/2023 5:04:19 AM  Radiology No results found.  Procedures Procedures    Medications Ordered in ED Medications  HYDROcodone-acetaminophen (NORCO/VICODIN) 5-325 MG per tablet 2 tablet (has no administration in time range)    ED Course/ Medical Decision Making/ A&P  Patient is a 53 year old female presenting with chest discomfort as described in the HPI.  Patient arrives here with stable vital signs and is afebrile.  Physical examination reveals reproducible discomfort to the right upper chest, but is otherwise unremarkable.  Laboratory studies obtained including CBC, basic metabolic panel, and troponin, all of which are basically normal.  Chest x-ray shows no acute process.  Patient given Vicodin and pain is improved.  I highly suspect a musculoskeletal etiology to her discomfort and feel as though she can safely be discharged.  Symptoms are atypical for cardiac pain, she has a negative workup despite many hours of symptoms, and has had a negative stress test in the past few years.  She will be discharged with outpatient  follow-up.  Final Clinical Impression(s) / ED Diagnoses Final diagnoses:  None    Rx / DC Orders ED Discharge Orders     None         Geoffery Lyons, MD 08/18/23 [phone]

## 2023-08-18 NOTE — ED Triage Notes (Signed)
 Pt c/o chest pain that radiates to right shoulder with dizziness that started yesterday  Pt c/o sob with exertion

## 2023-08-18 NOTE — Discharge Instructions (Signed)
 Take ibuprofen 600 mg every 6 hours as needed for pain.  Follow-up with primary doctor if not improving in the next few days, and return to the ER if your symptoms significantly worsen or change.

## 2023-08-26 ENCOUNTER — Other Ambulatory Visit (HOSPITAL_COMMUNITY): Payer: Self-pay | Admitting: Nurse Practitioner

## 2023-08-26 ENCOUNTER — Ambulatory Visit (HOSPITAL_COMMUNITY)
Admission: RE | Admit: 2023-08-26 | Discharge: 2023-08-26 | Disposition: A | Discharge status: Home / Self Care | Source: Ambulatory Visit | Attending: Nurse Practitioner | Admitting: Nurse Practitioner

## 2023-08-26 DIAGNOSIS — M4317 Spondylolisthesis, lumbosacral region: Secondary | ICD-10-CM | POA: Diagnosis not present

## 2023-08-26 DIAGNOSIS — M5416 Radiculopathy, lumbar region: Secondary | ICD-10-CM

## 2023-08-26 DIAGNOSIS — M47816 Spondylosis without myelopathy or radiculopathy, lumbar region: Secondary | ICD-10-CM | POA: Diagnosis not present

## 2023-08-26 DIAGNOSIS — M5135 Other intervertebral disc degeneration, thoracolumbar region: Secondary | ICD-10-CM | POA: Diagnosis not present

## 2023-08-26 DIAGNOSIS — M4857XA Collapsed vertebra, not elsewhere classified, lumbosacral region, initial encounter for fracture: Secondary | ICD-10-CM | POA: Diagnosis not present

## 2023-08-26 MED ORDER — GADOBUTROL 1 MMOL/ML IV SOLN
8.0000 mL | Freq: Once | INTRAVENOUS | Status: AC | PRN
  Administered 2023-08-26: 8 mL via INTRAVENOUS

## 2023-08-27 ENCOUNTER — Ambulatory Visit: Payer: Self-pay | Admitting: Primary Care

## 2023-08-27 NOTE — Telephone Encounter (Signed)
  Chief Complaint: low back pain Symptoms: pain Frequency: began yesterday after work injury Pertinent Negatives: Patient denies weakness, numbness, loss of bowel or bladder control Disposition: [] ED /[x] Urgent Care (no appt availability in office) / [] Appointment(In office/virtual)/ []  [facility]/ [] Home Care/ [] Refused Recommended Disposition /[] [facility]/ []  Follow-up with PCP Additional Notes: Patient calls reporting low back pain that radiates down L leg to foot after injury lifting a box yesterday. Patient states it occurred while at work and she was evaluated in work clinic. Patient reports being given prednisone and advised to take tylenol, but reports pain is still 9/10. Per protocol, patient to be evaluated within 4 hours. Patient requesting to establish care at [facility] facility for PCP. Scheduled new patient OV for 09/23/23 at 1500. Patient advised to see care at [facility] or employer clinic within 4 hours. Patient states she will call them now. Care advice reviewed, patient verbalized understanding and denies further questions at this time.    Copied from CRM [id number]. Topic: Clinical - Red Word Triage >> Aug 27, 2023  8:17 AM Gaetano Hawthorne wrote: [facility] that prompted transfer to Nurse Triage: severe pain down her legs, patient sent to a work clinic where they provided prednisone, patient believes she was lifting a box at work. Patient stated a few times that she was in pain. Reason for Disposition  [1] SEVERE pain (e.g., excruciating) AND [2] not improved 2 hours after pain medicine/ice packs  Answer Assessment - Initial Assessment Questions 1. MECHANISM: "How did the injury happen?" (Consider the possibility of domestic violence or elder abuse)     Lifting a box on packing line, felt a pop. Was seen at work clinic. 2. ONSET: "When did the injury happen?" (Minutes or hours ago)     Yesterday 3. LOCATION: "What part of the back is injured?"     Low back, radiates down L leg  to bottom of foot 4. SEVERITY: "Can you move the back normally?"     Walking a little slower than normal. 5. PAIN: "Is there any pain?" If Yes, ask: "How bad is the pain?"   (Scale 1-10; or mild, moderate, severe)     9/10 6. CORD SYMPTOMS: Any weakness or numbness of the arms or legs?"     Denies 7. SIZE: For cuts, bruises, or swelling, ask: "How large is it?" (e.g., inches or centimeters)     Denies 8. TETANUS: For any breaks in the skin, ask: "When was the last tetanus booster?"     2023 9. OTHER SYMPTOMS: "Do you have any other symptoms?" (e.g., abdomen pain, blood in urine)     Denies  Protocols used: Back Injury-A-AH
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
## 2023-09-23 ENCOUNTER — Ambulatory Visit: Payer: Self-pay | Admitting: Nurse Practitioner

## 2024-01-18 ENCOUNTER — Other Ambulatory Visit (HOSPITAL_COMMUNITY): Payer: Self-pay | Admitting: Internal Medicine

## 2024-01-18 DIAGNOSIS — Z1231 Encounter for screening mammogram for malignant neoplasm of breast: Secondary | ICD-10-CM

## 2024-01-29 ENCOUNTER — Inpatient Hospital Stay (HOSPITAL_COMMUNITY): Admission: RE | Admit: 2024-01-29 | Source: Ambulatory Visit

## 2024-02-04 ENCOUNTER — Ambulatory Visit (HOSPITAL_COMMUNITY)

## 2024-02-04 DIAGNOSIS — E1122 Type 2 diabetes mellitus with diabetic chronic kidney disease: Secondary | ICD-10-CM | POA: Diagnosis not present

## 2024-02-04 DIAGNOSIS — I10 Essential (primary) hypertension: Secondary | ICD-10-CM | POA: Diagnosis not present

## 2024-02-04 DIAGNOSIS — Z6832 Body mass index (BMI) 32.0-32.9, adult: Secondary | ICD-10-CM | POA: Diagnosis not present

## 2024-02-04 DIAGNOSIS — Z0000 Encounter for general adult medical examination without abnormal findings: Secondary | ICD-10-CM | POA: Diagnosis not present

## 2024-02-04 DIAGNOSIS — N182 Chronic kidney disease, stage 2 (mild): Secondary | ICD-10-CM | POA: Diagnosis not present

## 2024-02-04 DIAGNOSIS — M5432 Sciatica, left side: Secondary | ICD-10-CM | POA: Diagnosis not present

## 2024-02-09 ENCOUNTER — Other Ambulatory Visit (HOSPITAL_COMMUNITY): Payer: Self-pay | Admitting: Internal Medicine

## 2024-02-09 DIAGNOSIS — Z1231 Encounter for screening mammogram for malignant neoplasm of breast: Secondary | ICD-10-CM

## 2024-02-12 ENCOUNTER — Inpatient Hospital Stay (HOSPITAL_COMMUNITY): Admission: RE | Admit: 2024-02-12 | Source: Ambulatory Visit

## 2024-02-12 DIAGNOSIS — Z1231 Encounter for screening mammogram for malignant neoplasm of breast: Secondary | ICD-10-CM

## 2024-02-19 DIAGNOSIS — Z833 Family history of diabetes mellitus: Secondary | ICD-10-CM | POA: Diagnosis not present

## 2024-02-19 DIAGNOSIS — Z79899 Other long term (current) drug therapy: Secondary | ICD-10-CM | POA: Diagnosis not present

## 2024-02-19 DIAGNOSIS — G4700 Insomnia, unspecified: Secondary | ICD-10-CM | POA: Diagnosis not present

## 2024-02-19 DIAGNOSIS — E785 Hyperlipidemia, unspecified: Secondary | ICD-10-CM | POA: Diagnosis not present

## 2024-02-19 DIAGNOSIS — Z8249 Family history of ischemic heart disease and other diseases of the circulatory system: Secondary | ICD-10-CM | POA: Diagnosis not present

## 2024-02-19 DIAGNOSIS — R9389 Abnormal findings on diagnostic imaging of other specified body structures: Secondary | ICD-10-CM | POA: Diagnosis not present

## 2024-02-19 DIAGNOSIS — Z7982 Long term (current) use of aspirin: Secondary | ICD-10-CM | POA: Diagnosis not present

## 2024-02-19 DIAGNOSIS — R590 Localized enlarged lymph nodes: Secondary | ICD-10-CM | POA: Diagnosis not present

## 2024-02-19 DIAGNOSIS — Z90710 Acquired absence of both cervix and uterus: Secondary | ICD-10-CM | POA: Diagnosis not present

## 2024-02-19 DIAGNOSIS — Z7984 Long term (current) use of oral hypoglycemic drugs: Secondary | ICD-10-CM | POA: Diagnosis not present

## 2024-02-19 DIAGNOSIS — N289 Disorder of kidney and ureter, unspecified: Secondary | ICD-10-CM | POA: Diagnosis not present

## 2024-02-19 DIAGNOSIS — I10 Essential (primary) hypertension: Secondary | ICD-10-CM | POA: Diagnosis not present

## 2024-02-19 DIAGNOSIS — R918 Other nonspecific abnormal finding of lung field: Secondary | ICD-10-CM | POA: Diagnosis not present

## 2024-02-19 DIAGNOSIS — R079 Chest pain, unspecified: Secondary | ICD-10-CM | POA: Diagnosis not present

## 2024-02-19 DIAGNOSIS — I440 Atrioventricular block, first degree: Secondary | ICD-10-CM | POA: Diagnosis not present

## 2024-02-19 DIAGNOSIS — Z7985 Long-term (current) use of injectable non-insulin antidiabetic drugs: Secondary | ICD-10-CM | POA: Diagnosis not present

## 2024-02-19 DIAGNOSIS — E119 Type 2 diabetes mellitus without complications: Secondary | ICD-10-CM | POA: Diagnosis not present

## 2024-02-19 DIAGNOSIS — J9811 Atelectasis: Secondary | ICD-10-CM | POA: Diagnosis not present

## 2024-02-19 DIAGNOSIS — Z882 Allergy status to sulfonamides status: Secondary | ICD-10-CM | POA: Diagnosis not present

## 2024-02-19 DIAGNOSIS — E1140 Type 2 diabetes mellitus with diabetic neuropathy, unspecified: Secondary | ICD-10-CM | POA: Diagnosis not present

## 2024-02-19 DIAGNOSIS — N179 Acute kidney failure, unspecified: Secondary | ICD-10-CM | POA: Diagnosis not present

## 2024-03-02 ENCOUNTER — Other Ambulatory Visit (HOSPITAL_COMMUNITY): Payer: Self-pay | Admitting: Internal Medicine

## 2024-03-02 DIAGNOSIS — Z1231 Encounter for screening mammogram for malignant neoplasm of breast: Secondary | ICD-10-CM

## 2024-03-07 ENCOUNTER — Inpatient Hospital Stay (HOSPITAL_COMMUNITY): Admission: RE | Admit: 2024-03-07 | Source: Ambulatory Visit

## 2024-03-07 DIAGNOSIS — Z1231 Encounter for screening mammogram for malignant neoplasm of breast: Secondary | ICD-10-CM
